# Patient Record
Sex: Female | Born: 1985 | Race: White | Hispanic: No | Marital: Married | Smoking: Former smoker
Health system: Southern US, Community
[De-identification: ages and names within clinical notes are randomized; demographics above are authoritative.]

## PROBLEM LIST (undated history)

## (undated) DIAGNOSIS — F909 Attention-deficit hyperactivity disorder, unspecified type: Secondary | ICD-10-CM

## (undated) DIAGNOSIS — G5750 Tarsal tunnel syndrome, unspecified lower limb: Secondary | ICD-10-CM

## (undated) DIAGNOSIS — M67472 Ganglion, left ankle and foot: Secondary | ICD-10-CM

## (undated) DIAGNOSIS — N946 Dysmenorrhea, unspecified: Secondary | ICD-10-CM

## (undated) DIAGNOSIS — Z803 Family history of malignant neoplasm of breast: Secondary | ICD-10-CM

## (undated) DIAGNOSIS — Z9189 Other specified personal risk factors, not elsewhere classified: Secondary | ICD-10-CM

## (undated) DIAGNOSIS — M67471 Ganglion, right ankle and foot: Secondary | ICD-10-CM

## (undated) HISTORY — DX: Ganglion, right ankle and foot: M67.471

## (undated) HISTORY — DX: Ganglion, left ankle and foot: M67.472

## (undated) HISTORY — PX: WISDOM TOOTH EXTRACTION: SHX21

## (undated) HISTORY — DX: Other specified personal risk factors, not elsewhere classified: Z91.89

## (undated) HISTORY — DX: Dysmenorrhea, unspecified: N94.6

## (undated) HISTORY — PX: APPENDECTOMY: SHX54

## (undated) HISTORY — DX: Family history of malignant neoplasm of breast: Z80.3

## (undated) HISTORY — DX: Tarsal tunnel syndrome, unspecified lower limb: G57.50

## (undated) HISTORY — DX: Attention-deficit hyperactivity disorder, unspecified type: F90.9

---

## 2012-02-22 ENCOUNTER — Observation Stay: Payer: Self-pay | Admitting: Surgery

## 2012-02-22 HISTORY — PX: APPENDECTOMY: SHX54

## 2012-02-22 LAB — URINALYSIS, COMPLETE
Bilirubin,UR: NEGATIVE
Leukocyte Esterase: NEGATIVE
RBC,UR: 1 /HPF (ref 0–5)

## 2012-02-22 LAB — COMPREHENSIVE METABOLIC PANEL
BUN: 17 mg/dL (ref 7–18)
Calcium, Total: 9 mg/dL (ref 8.5–10.1)
Chloride: 105 mmol/L (ref 98–107)
Co2: 29 mmol/L (ref 21–32)
EGFR (African American): >60
Glucose: 98 mg/dL (ref 65–99)
Osmolality: 279 (ref 275–301)
Potassium: 4 mmol/L (ref 3.5–5.1)
Sodium: 139 mmol/L (ref 136–145)
Total Protein: 7.6 g/dL (ref 6.4–8.2)

## 2012-02-22 LAB — PREGNANCY, URINE: Pregnancy Test, Urine: NEGATIVE m[IU]/mL

## 2012-02-22 LAB — CBC
HGB: 12.4 g/dL (ref 12.0–16.0)
MCHC: 33.6 g/dL (ref 32.0–36.0)
Platelet: 217 10*3/uL (ref 150–440)
RDW: 12.8 % (ref 11.5–14.5)
WBC: 12.1 10*3/uL — ABNORMAL HIGH (ref 3.6–11.0)

## 2012-02-24 LAB — PATHOLOGY REPORT

## 2012-07-04 ENCOUNTER — Ambulatory Visit
Admission: RE | Admit: 2012-07-04 | Discharge: 2012-07-04 | Disposition: A | Discharge status: Home / Self Care | Payer: No Typology Code available for payment source | Source: Ambulatory Visit | Attending: Family Medicine | Admitting: Family Medicine

## 2012-07-04 ENCOUNTER — Other Ambulatory Visit: Payer: Self-pay | Admitting: Family Medicine

## 2012-07-04 DIAGNOSIS — Z0000 Encounter for general adult medical examination without abnormal findings: Secondary | ICD-10-CM

## 2013-04-10 ENCOUNTER — Observation Stay: Payer: Self-pay | Admitting: Obstetrics and Gynecology

## 2013-04-10 LAB — WET PREP, GENITAL

## 2013-04-12 ENCOUNTER — Observation Stay: Payer: Self-pay | Admitting: Obstetrics and Gynecology

## 2013-06-08 ENCOUNTER — Observation Stay: Payer: Self-pay

## 2013-06-11 ENCOUNTER — Ambulatory Visit: Payer: Self-pay | Admitting: Obstetrics and Gynecology

## 2013-06-11 LAB — CBC WITH DIFFERENTIAL/PLATELET
Eosinophil %: 0.5 %
HCT: 34.5 % — ABNORMAL LOW (ref 35.0–47.0)
HGB: 12.1 g/dL (ref 12.0–16.0)
Lymphocyte #: 1.6 10*3/uL (ref 1.0–3.6)
MCH: 32.9 pg (ref 26.0–34.0)
MCHC: 35.1 g/dL (ref 32.0–36.0)
Monocyte #: 0.6 x10 3/mm (ref 0.2–0.9)
Platelet: 174 10*3/uL (ref 150–440)
RDW: 13.2 % (ref 11.5–14.5)

## 2013-06-12 ENCOUNTER — Inpatient Hospital Stay: Payer: Self-pay | Admitting: Obstetrics and Gynecology

## 2013-06-13 LAB — HEMATOCRIT: HCT: 24.1 % — ABNORMAL LOW (ref 35.0–47.0)

## 2014-09-16 DIAGNOSIS — Z803 Family history of malignant neoplasm of breast: Secondary | ICD-10-CM

## 2014-09-16 HISTORY — DX: Family history of malignant neoplasm of breast: Z80.3

## 2015-02-06 NOTE — Op Note (Signed)
PATIENT NAME:  Melissa Pierce, Li MR#:  [id number] DATE OF BIRTH:  1986/10/11  DATE OF PROCEDURE:  06/12/2013  PREOPERATIVE DIAGNOSES: 1. Term intrauterine pregnancy at 39 weeks zero days gestation.  2. 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   POSTOPERATIVE DIAGNOSES:  1. Term intrauterine pregnancy at 39 weeks zero days gestation.  2. 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   PROCEDURE  PERFORMED: Low transverse c section via Pfannenstiel skin incision.   ANESTHESIA USED: Spinal.   PRIMARY SURGEON: Dr. Vena AustriaAndreas Jhan Conery   ESTIMATED BLOOD LOSS: 600 mL.  OPERATIVE FLUIDS: 1200 mL of crystalloid.   URINE OUTPUT: 150 mL of clear urine.   COMPLICATIONS: None.   INTRAOPERATIVE FINDINGS: Normal tubes, ovaries, and uterus. Liveborn female infant weighing 7 pounds, 8 ounces, 3390 grams, Apgars of 8 and 9.   SPECIMENS REMOVED: None.   DRAINS OR TUBES: Foley to gravity drainage, On-Q catheter system.   PREOPERATIVE ANTIBIOTICS: Two grams of Ancef.   THE PATIENT CONDITION FOLLOWING PROCEDURE: Stable.   PROCEDURE IN DETAIL: Risks, benefits, and alternatives of the procedure were discussed with the patient prior to proceeding to the Operating Room. The patient had received predelivery [facility] with the decision made to proceed with primary low transverse cesarean section given her prior birth trauma with evidence of internal sphincter defect in the anal ultrasound. The patient was taken to the operating room where she was placed under spinal anesthesia. She was positioned in the supine position, prepped and draped in the usual sterile fashion. A timeout procedure was performed. The level of anesthetic was checked prior to proceeding with the case and noted to be adequate. A Pfannenstiel skin incision was made 2 cm above the  pubic symphysis, carried down sharply to the level of the rectus fascia using a scalpel. The fascia was then incised in the midline and the fascial incision extended using Mayo scissors. The superior border of the rectus fascia was grasped with 2 Coker clamps. The underlying rectus muscles were dissected off the fascia using blunt dissection.  The median raphe was incised using Mayo scissors. The inferior border of the rectus fascia was dissected off the rectus muscles in a similar fashion. The midline was identified. The peritoneum was entered bluntly, and the peritoneal incision extended using manual traction. A bladder blade was placed displacing the bladder cephalad. Following this, a bladder flap was created using Metzenbaum scissors and further developed digitally. The bladder blade was then replaced. A low transverse incision was made on the uterus using a scalpel. The uterus was entered bluntly using the operator's finger. Hysterotomy incision was extended using manual traction. Placing the operator's hand into the hysterotomy incision the infant was noted to be in OA position. Amniotomy was performed using an Allis clamp noting clear fluid. The vertex was grasped, flexed, brought to the incision, and delivered atraumatically using fundal pressure. The remainder of the body delivered with ease. The infant was suctioned, cord was clamped and cut and the infant was passed to the awaiting pediatrician. The placenta was then delivered using manual extraction. The uterus was exteriorized and wiped clean of clots and debris, and the hysterotomy incision was repaired using a two layer closure of 0 Vicryl with the first being a running locked, the second a vertical imbricating. The uterus was then returned to the abdomen. The peritoneal gutters  were wiped clean of clots and debris. The muscles were reapproximated in the midline using a 2-0 Vicryl mattress stitch. Following this, the superior border of the rectus  fascia was grasped with two Coker clamps. The On-Q catheters were placed subfascially as per the usual protocol. Following this, the fascia was then closed using a looped #1 PDS in a running fashion. Subcutaneous tissue was irrigated and hemostasis achieved using a Bovie. Skin was closed using a insorb stapler. The On-Q catheters were bolused with 5% of 0.5% bupivacaine each. Sponge, needle, and instrument counts were correct x2. The patient tolerated the procedure well and was taken to the recovery room in stable condition.    ____________________________ Florina Ou. Bonney Aid, MD ams:sg D: 06/20/2013 18:27:00 ET T: 06/20/2013 19:35:03 ET JOB#: [id number]  cc: Florina Ou. Bonney Aid, MD, <Dictator> Lorrene Reid MD ELECTRONICALLY SIGNED 07/09/2013 20:06

## 2015-02-08 NOTE — Op Note (Signed)
PATIENT NAME:  Melissa Pierce, Melissa Pierce MR#:  [id number] DATE OF BIRTH:  1986-10-03  DATE OF PROCEDURE:  02/22/2012  PROCEDURES PERFORMED:  1. Laparoscopic appendectomy.  2. Suture repair of colon injury.   SURGEON: Claude MangesWilliam F. Andre Gallego, MD  PREOPERATIVE DIAGNOSIS: Acute appendicitis.  POSTOPERATIVE DIAGNOSES:  1. Acute appendicitis, suppurative.  2. 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   PROCEDURE IN DETAIL: The patient was placed supine on the Operating Room table and prepped and draped in the usual sterile fashion. A limited incision was made in the supraumbilical midline and this was carried down through the linea alba with the electrocautery and horizontal mattress sutures of 0 Vicryl were placed and as I cut into the peritoneum in order to place a Hassan cannula I injured an artery. Ultimately this was elevated into the wound (after the fascial opening was enlarged slightly) and I was able to ascertain that this was a pericolonic artery, either an artery going directly into the colon or an artery on the very thin omentum draping over the colon. In any event, I was able to control this with a single 2-0 silk ligature but I subsequently repaired the colon with 3-0 silk seromuscular Lembert sutures oriented in a transverse fashion over top of this. A Hassan cannula could then be introduced and a 15 mmHg CO2 pneumoperitoneum was created. There was some blood in the peritoneum from the prior bleeding and this was all suctioned clear and a total of 2 liters of warm normal saline was used to irrigate out the entire abdomen such that there was essentially no remaining blood present. 1 to 1.5 liters of this irrigant was performed initially and then the last 500 mL of irrigant was used after the appendectomy. All of the gutters and the subdiaphragmatic spaces in the perisplenic region and the perihepatic region were all suctioned clear as well as the pelvis such that there was only some old blood present at the top of the  omentum at the conclusion of the procedure. The main portion of the procedure (the appendectomy) was undertaken by finding the tip of the appendix which was exudative and this was surrounded in fairly dense fibrinous scar tissue in a retrocecal location and the appendix was bluntly dissected out of this inflammatory cavity and the mesoappendix was taken down with the Harmonic scalpel down to the appendiceal base and the appendectomy was performed with an Endo GIA stapling device. This was placed just across a few millimeters of the cecum below the appendix base as the appendix was quite abnormally shaped in that it was very short and very thickened even up to the cecum. The appendix was placed in an Endo Catch bag and extracted from the abdomen via the supraumbilical port site and then the remainder of the irrigation was performed. Hemostasis was excellent and as previously stated all of the blood was successfully evacuated from the peritoneal cavity. The omentum was very thin but it was draped over top of the small intestine down towards the pelvis and then the peritoneum was desufflated and decannulated and the linea alba was closed with a running 0 PDS suture and the previously placed horizontal mattress sutures of 0 Vicryl were tied one to another. All three skin sites were closed with subcuticular 5-0 Monocryl and suture strips. The patient tolerated the procedure well.    ____________________________ Claude MangesWilliam F. Yanni Ruberg, MD wfm:cms D: 02/22/2012 22:12:47 ET T: 02/23/2012 11:16:54 ET JOB#: [id number]  cc: Claude MangesWilliam F. Brunilda Eble, MD, <Dictator> Claude MangesWILLIAM F Sherley Mckenney MD ELECTRONICALLY SIGNED  02/23/2012 20:03 

## 2015-02-08 NOTE — Discharge Summary (Signed)
PATIENT NAME:  Melissa Pierce, Melissa Pierce MR#:  [id number] DATE OF BIRTH:  21-Sep-1986  DATE OF ADMISSION:  02/22/2012 DATE OF DISCHARGE:  02/24/2012  PRINCIPLE DIAGNOSIS: Acute appendicitis, suppurative.   PRINCIPLE PROCEDURE PERFORMED DURING THIS ADMISSION: Laparoscopic appendectomy.   HOSPITAL COURSE: [name] was admitted to the hospital and given IV antibiotics and taken to the Operating Room where she underwent the above-mentioned procedure for the above-mentioned diagnosis. She was discharged home on postoperative day two and asked to make an appointment to follow-up with me.   ____________________________ Claude MangesWilliam F. Diavion Labrador, MD wfm:rbg D: 03/08/2012 07:53:49 ET T: 03/09/2012 09:40:28 ET JOB#: [id number]  cc: Claude MangesWilliam F. Sohan Potvin, MD, <Dictator> Claude MangesWILLIAM F Reshad Saab MD ELECTRONICALLY SIGNED 03/16/2012 20:25

## 2015-02-08 NOTE — H&P (Signed)
PATIENT NAME:  Melissa Pierce, Melissa MR#:  [id number] DATE OF BIRTH:  [birth year]  DATE OF ADMISSION:  02/22/2012  ADMITTING DIAGNOSIS: Acute appendicitis.   HISTORY: This is a 29 year old otherwise healthy white female presents with her husband with a two day history of progressive abdominal pain and cramping which has migrated to the right lower quadrant associated with no nausea, no vomiting, no anorexia, no diarrhea, no sick contacts, no fever. Patient had pain all day yesterday, essentially could not really work or do anything. Came to the Emergency Room today. White count was found to be elevated. Patient had CT scan of the abdomen and pelvis which is consistent with appendicitis.   ALLERGIES: None.   MEDICATIONS: Oral birth control pill.   PAST MEDICAL HISTORY: None.   PAST SURGICAL HISTORY: Wisdom tooth extraction.   SOCIAL HISTORY: She is married, has an 487-month-old child. Does not smoke. Does not drink. Does not use drugs. Is employed.   REVIEW OF SYSTEMS: As described above.   PHYSICAL EXAMINATION:  GENERAL: The patient is an alert and oriented thin, white female in no obvious distress.   VITAL SIGNS: Temperature 96, pulse 84, respiratory rate 22 and unlabored, blood pressure 121/75, room air saturation 100%, weight 130 pounds, BMI 22.3, height 5 foot 4.   NECK: Supple. No adenopathy.   LUNGS: Clear.   HEART: Regular rate and rhythm.   ABDOMEN: Abdomen demonstrates peritoneal signs within the right lower quadrant with a positive Murphy sign and positive Rovsing's sign. No surgical scars. No abdominal mass. No hernias.   RECTAL/GENITOURINARY: Deferred.   EXTREMITIES: Well perfused, warm. No rash. No jaundice, scleral icterus.   NEUROLOGIC/PSYCHIATRIC: Unremarkable and normal.   LABORATORY, DIAGNOSTIC, AND RADIOLOGICAL DATA: Glucose 98, BUN 17, creatinine 0.6, sodium 139, potassium 4.0, chloride 105, CO2 29, calcium 9.0, white count 12.1, hemoglobin 12.4, platelet count 217,000.  Urinalysis is negative. Urine pregnancy test is negative. Review of CT scan demonstrates a dilated appearing retrocecal appendicitis. No other abnormalities are noted.   IMPRESSION: 29 year old healthy white female with acute appendicitis which appears to be suppurative.   PLAN: Patient will be admitted. Laparoscopic appendectomy or possible open appendectomy was discussed with the patient and her husband. All of their questions are answered. Preop orders have been written for heparin and intravenous Mefoxin. Due to the Operating Room schedule she will need be admitted and done later this afternoon or early evening. I had outlined with her the risks of surgery and all of her questions were answered.   TOTAL TIME SPENT: 45 minutes.   ____________________________ Redge GainerMark A. Egbert GaribaldiBird, MD mab:cms D: 02/22/2012 13:29:26 ET T: 02/22/2012 13:40:08 ET JOB#: [id number]  cc: Loraine LericheMark A. Egbert GaribaldiBird, MD, <Dictator> Raynald KempMARK A Sherill Wegener MD ELECTRONICALLY SIGNED 02/24/2012 12:53

## 2015-02-24 NOTE — H&P (Signed)
L&D Evaluation:  History:  HPI 29 yo [id number] with EDC=06/19/2013 by LMP=11/13/2012 presents at 289w3d gestational age with c/o decreased fetal movement this AM. BAby has moved this AM but not "the usual amt" Denies vaginal spotting or LOF. Has been having more frequent contractions, but inconsistent in frequency and strength.    Her pregnancy history is complicated by a fourth-degree laceration in her first delivery about 2 years ago. She has recently been seen by a urogynecologist, Dr Ashley RoyaltyMatthews, for the stool leakage she has had since her delivery and she advised against future vaginal deliveries, and has recommended having a CS. Her CS is scheduled for next week.  O+/ RI / VZI   Presents with decreased fetal movement   Patient's Medical History No Chronic Illness   Patient's Surgical History Appendectomy  wisdom teeth extraction, 4th degree repair   Medications Pre Natal Vitamins   Allergies Iodine   Social History none   Family History Non-Contributory   ROS:  ROS see HPI   Exam:  Vital Signs stable   Urine Protein not completed   General no apparent distress   Mental Status clear   Abdomen gravid, non-tender   Fetal Position vtx   Pelvic no external lesions, 2/70%/-2   Mebranes Intact   FHT normal rate with no decels, 135-140 with accels to 150s to 160s   FHT Description moderate variability   Ucx 4 ctxs in 40 min   Skin dry   Impression:  Impression IUP at 38 3/7 weeks with reactive NST   Plan:  [facility] precautions.. FU as scheduled for preop appt   Electronic Signatures: Trinna BalloonGutierrez, Marquerite Forsman L (CNM)  (Signed 23-Aug-14 13:13)  Authored: L&D Evaluation   Last Updated: 23-Aug-14 13:13 by Trinna BalloonGutierrez, Pricilla Moehle L (CNM)

## 2015-02-24 NOTE — H&P (Signed)
L&D Evaluation:  History:  HPI 29 yo G4P1021 at 521w0d gestational age by LMP consistent with 1st trimester ultrasound.  Patient presents with vaginal spotting since about 1pm today.  She notes that she has had Braxton-Hicks contractions since yesterday.  She has noted positive fetal movement and leakage of fluid.  She noted "spotting" of blood in her underwear this afternoon.  It has also stained her jeans.  Since then she has had occasional spotting. She was seen by Mrs Dortha KernHuprich in clinic today and on pelvic exam there was a small amount of dark blood in the vaginal vault with no active bleeding from the os.  Mrs Dortha KernHuprich noted no other source of bleeding.  The patient does not smoke or do drugs. Her placenta is located posterior and fundally.   She has had no uterine surgeries.  She denies vaginal symptoms and recent intercourse.  She has no urinary symptoms. Her pregnancy history is complicated by a fourth-degree laceration in her first delivery about 2 years ago.  O+/ RI / VZI   Patient's Medical History No Chronic Illness   Patient's Surgical History Appendectomy  wisdom teeth extraction   Medications Pre Natal Vitamins   Allergies Iodine   Social History none   Family History Non-Contributory   ROS:  ROS All systems were reviewed.  HEENT, CNS, GI, GU, Respiratory, CV, Renal and Musculoskeletal systems were found to be normal., unless noted in HPI   Exam:  Vital Signs BP 110/66, P 86   General no apparent distress   Mental Status clear   Chest clear   Heart normal sinus rhythm   Abdomen gravid, non-tender   Estimated Fetal Weight Average for gestational age   Fetal Position cephalic by ultrasound   Back no CVAT   Edema no edema   Pelvic no external lesions, at posterior forchette there is an area of tenderness and erythema, no active bleeding from this site.  Per patient this site is from her repair of her 4th degree laceration.  No vaginal lesions noted.  The cervix  appears somewhat friable with tenderness noted with light application of pressure with a large q-tip.  about 1 ml of dark blood in vaginal vault.  no active bleeding noted.   Mebranes Intact   FHT normal rate with no decels   FHT Description 130/mod var/+accels/no decels   Ucx irregular, 3-4 q 10 min   Ucx Pain Scale very mild   Other Bedside ultrasound shows viable Intrauterine pregnancy in cephalic presentation with subjectively normal amniotic fluid.  positive fetal movement noted.  + fetal cardiac activity noted.  Placenta noted to be fundal and posterior with no obvious abruption.   Impression:  Impression vaginal bleeding in 3rd trimester   Plan:  Plan UA, EFM/NST, monitor contractions and for cervical change   Comments 1) Vaginal bleeding: no active bleeding at this time.  Though she has had some event where she is having some amount of what appears to be dark, likely older blood.  This is either coming from the uterus or from the cervix.  This is stable for now.  She was given very strict precautions regarding vaginal bleeding should any further episods occur, especially bright red bleeding.  She has a regularly scheduled appt first thing tomorrow morning.  I encouraged her to keep that appt for follow up and assessment of bleeding. I also encouraged her to have a low threshold for returning to L&D should further bleeding occur.  We discussed the etiologies  of vaginal bleeding in the 3rd trimester, including placental abruption, cervical/vaginal infection.   I collected a wet mount (negative), gonorrhea/chlamydia for NAAT.   2) preterm contractions: She is having contractions right now, though they seemed to slow somewhat while she was here. She is hydrating well PO.  Checked cultures, as above, wet mount performed.  GBS collected just in case.  Cervical exam by me showed a closed cervix.  She was called finger tip by Mrs Dortha KernHuprich in clinic today.  This is likely an interobserver  difference that represents no significant clinical change.  Discussed use of BMTZ.  Discussed use when concern for need for or emminent delivery within 7 days.  At this point I do not have good evidence that suggests that she will have delivery within 7 days. Will hold BMTZ for now.  This could change with any change in clinical status.  GBS collected today. Discussed need to  collect again in 5 weeks (or at her 36 week visit), if negative today.  3) Fetal well being: reassuring overall.  Ultrasound was reassuring.  FHR tracing was category 1 and she had a negative CST.    4) Follow up tomorrow AM as previously discussed.   Follow Up Appointment already scheduled. tomorrow   Labs:  Lab Results: Routine Micro:  25-Jun-14 18:23   Micro Text Report WET PREP   COMMENT                   RARE WHITE BLOOD CELLS SEEN   COMMENT                   NO TRICHOMONAS,SPERMATOZOA,YEAST,OR CLUE CELLS SEEN   ANTIBIOTIC                       Comment 1. RARE WHITE BLOOD CELLS SEEN  Comment 2. NO TRICHOMONAS,SPERMATOZOA,YEAST,OR CLUE CELLS SEEN  Result(s) reported on 10 Apr 2013 at 06:43PM.   Electronic Signatures: Conard NovakJackson, Ra Pfiester D (MD)  (Signed 25-Jun-14 19:12)  Authored: L&D Evaluation, Labs   Last Updated: 25-Jun-14 19:12 by Conard NovakJackson, Leighton Brickley D (MD)

## 2015-02-24 NOTE — H&P (Signed)
L&D Evaluation:  History:  HPI 29 yo G4P1021 at 8773w2d gestational age by LMP consistent with 1st trimester ultrasound.  Patient presents with vaginal spotting that initially started 2 days ago and was evaluated in clinic as well as here on L&D on 04/10/13.  Her placenta is located posterior and fundally per Dr. Edison PaceJackson's US on 04/10/13.   She has had no uterine surgeries.  She denies vaginal symptoms and recent intercourse.  She has no urinary symptoms. Reports bleeding had stopped 04/10/13 this AM some pink discharge with passage of small clot.  No futher bleeding or clots, no abdominal pain or contractions.  Denies LOF.  Good fetal movement.   Her pregnancy history is complicated by a fourth-degree laceration in her first delivery about 2 years ago.  O+/ RI / VZI   Presents with vaginal bleeding   Patient's Medical History No Chronic Illness   Patient's Surgical History Appendectomy  wisdom teeth extraction   Medications Pre Natal Vitamins   Allergies Iodine   Social History none   Family History Non-Contributory   ROS:  ROS All systems were reviewed.  HEENT, CNS, GI, GU, Respiratory, CV, Renal and Musculoskeletal systems were found to be normal., unless noted in HPI   Exam:  Vital Signs stable   General no apparent distress   Mental Status clear   Chest clear   Heart normal sinus rhythm   Abdomen gravid, non-tender, soft   Estimated Fetal Weight Average for gestational age   Fetal Position vtx   Back no CVAT   Edema no edema   Pelvic no external lesions, ftp/thick/high light dark brown discharge noted in posterior vault consistent with old blood no active bleeding   Mebranes Intact   FHT normal rate with no decels   FHT Description 1350/mod var/+accels/no decels   Ucx absent   Other movement noted.  + fetal cardiac activity noted.  Placenta noted to be fundal and posterior with no obvious abruption.   Impression:  Impression painless vaginal bleeding in  3rd trimester   Plan:  Plan EFM/NST, monitor contractions and for cervical change   Comments - GC/CT from 6/25 pending - no active bleeding - Based on exam and category I tracing no concern for abruption, PTL, and previa ruled out by prior US - Routine bleeding precautions and reassurance,  has follow up on 04/23/13   Follow Up Appointment already scheduled   Electronic Signatures: Lorrene ReidStaebler, Channing Savich M (MD)  (Signed 27-Jun-14 14:00)  Authored: L&D Evaluation   Last Updated: 27-Jun-14 14:00 by Lorrene ReidStaebler, Tyra Michelle M (MD)

## 2016-08-09 ENCOUNTER — Encounter: Payer: Self-pay | Admitting: Family Medicine

## 2016-08-09 ENCOUNTER — Ambulatory Visit (INDEPENDENT_AMBULATORY_CARE_PROVIDER_SITE_OTHER): Payer: Managed Care, Other (non HMO) | Admitting: Family Medicine

## 2016-08-09 VITALS — BP 116/70 | HR 65 | Temp 98.1°F | Resp 16 | Ht 65.0 in | Wt 154.0 lb

## 2016-08-09 DIAGNOSIS — R202 Paresthesia of skin: Secondary | ICD-10-CM

## 2016-08-09 MED ORDER — PREDNISONE 10 MG PO TABS: ORAL_TABLET | ORAL | 0 refills | Status: DC

## 2016-08-09 NOTE — Assessment & Plan Note (Addendum)
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 No obvious deformity or pain on exam. Symptoms are predictable >1 mile into runs, otherwise asymptomatic with casual activity. No known injury or trauma. Differential includes: possible morton's neuroma given location assoc with 4th toes, possible peroneal nerve entrapment but symptoms do involve all 5 digits so less likely, less likely tarsal tunnel syndrome, also consider metatarsalgia related to abnormal forefoot mechanics.  Plan: 1. Trial on prednisone 6d taper for potent anti-inflammatory, then may do 1-2 week trial on NSAID if helping, modify running for few days with relative rest, then can resume, RICE if swelling 2. Discussion that may experience significant relief ultimately with rest and activity modification, as this has helped in the past, however agree given chronic nature and worsening, will explore further work-up 3. [place] [facility] [place] for [place] and management, anticipate [facility] ultrasound, if identify morton neuroma may consider steroid inj, likely will need custom padding or orthotics 4. Follow-up as needed

## 2016-08-09 NOTE — Progress Notes (Signed)
Subjective:    Patient ID: Melissa Pierce, female    DOB: Aug 18, 1986, 30 y.o.   MRN: [id number]  Melissa Pierce is a 30 y.o. female presenting on 08/09/2016 for Numbness (as per pt runs has some issue with feets)  HPI  Bilateral Feet Numbness (R>L): - Reports symptoms onset for past 2-3 years, gradual onset with with feet falling asleep and 4th and 5th toes (Right worse than Left), going numb, starts in ball of foot but does not progress to heel. Describes numbness during run, but when stops to wake up foot, she will feel sharp pains with "pins and needles". Worsening over past 3 months. - Runs mostly outdoors, on asphalt streets, about >3 miles, around 4-5 days a week, also does some obstacle course racing (few times a year), usually about 1 to 1.5 miles in onset of symptoms predictable  - Also reports onset of symptoms seem to be related to onset after 2nd pregnancy (3 years ago) prior did not have any symptoms - Has high arches, has tried multiple shoe inserts, different shoes without relief - Takes very infrequent ibuprofen for ankle (prior history of R ankle sprain) - admits to occasional R ankle swelling after activity - denies any acute injury, trauma, or fall, redness, bruising, significant swelling, weakness, numbness or tingling elsewhere in body   Social History  Substance Use Topics  . Smoking status: Former Games developer  . Smokeless tobacco: Former Neurosurgeon  . Alcohol use Yes    Review of Systems Per HPI unless specifically indicated above     Objective:    BP 116/70   Pulse 65   Temp 98.1 F (36.7 C) (Oral)   Resp 16   Ht 5\' 5"  (1.651 m)   Wt 154 lb (69.9 kg)   LMP 08/05/2016   BMI 25.63 kg/m   Wt Readings from Last 3 Encounters:  08/09/16 154 lb (69.9 kg)    Physical Exam  Constitutional: She appears well-developed and well-nourished. No distress.  Well-appearing, comfortable, cooperative, athletic  Musculoskeletal: She exhibits no edema or deformity.  Bilateral  Feet Inspection: Normal appearance, symmetrical. No deformity. Standing barefoot without significant pes cavus, does have some collapse of forefoot laterally. No edema, erythema, or ecchymosis.  Palpation: Forefoot, digits and interdigital spaces non-tender, no deformity or pain at head of 5th metatarsal b/l, no palpable masses or appreciated neuroma. Ankles and achilles non-tender. Heel and hindfoot non tender. Mid foot non-tender without abnormality.  ROM: Full active ROM plantar flex/ext, inv/eversion Special Testing: Talar tilt and shift intact ROM. Tinel's over lateral malleolus peroneal nerve with some reproduced tingling into 4th, 5th digits. Negative Tinel's over posterior tibial nerve. No clear reproduction of symptoms Strength: Full 5/5 str ankle/toe plantar/dorsiflex Neurovascular: Distally intact to light touch  Neurological: She is alert. She has normal reflexes. She exhibits normal muscle tone. Coordination normal.  Skin: Skin is warm and dry. She is not diaphoretic.  Nursing note and vitals reviewed.      Assessment & Plan:   Problem List Items Addressed This Visit    Paresthesia of foot, bilateral - Primary    Suggestive of neuropathic pain etiology with paresthesias localized more to 4/5th digits R>L but bilateral, likely related to overuse and mechanics, no significant arch pathology, has inserts. No obvious deformity or pain on exam. Symptoms are predictable >1 mile into runs, otherwise asymptomatic with casual activity. No known injury or trauma. Differential includes: possible morton's neuroma given location assoc with 4th toes, possible peroneal nerve entrapment  but symptoms do involve all 5 digits so less likely, less likely tarsal tunnel syndrome, also consider metatarsalgia related to abnormal forefoot mechanics.  Plan: 1. Trial on prednisone 6d taper for potent anti-inflammatory, then may do 1-2 week trial on NSAID if helping, modify running for few days with relative  rest, then can resume, RICE if swelling 2. Discussion that may experience significant relief ultimately with rest and activity modification, as this has helped in the past, however agree given chronic nature and worsening, will explore further work-up 3. Referral to [facility] [place] for eval and management, anticipate [facility] ultrasound, if identify morton neuroma may consider steroid inj, likely will need custom padding or orthotics 4. Follow-up as needed      Relevant Medications   predniSONE (DELTASONE) 10 MG tablet   Other Relevant Orders   Ambulatory referral to Sports Medicine    Other Visit Diagnoses   None.     Meds ordered this encounter  Medications  .       .       .       . predniSONE (DELTASONE) 10 MG tablet    Sig: Take 6 tabs with breakfast Day 1, 5 tabs Day 2, 4 tabs Day 3, 3 tabs Day 4, 2 tabs Day 5, 1 tab Day 6.    Dispense:  21 tablet    Refill:  0      Follow up plan: Return in about 4 weeks (around 09/06/2016), or if symptoms worsen or fail to improve, for foot pain.  Saralyn PilarAlexander Anjani Feuerborn, DO [facility] 08/09/2016, 9:55 AM

## 2016-08-09 NOTE — Patient Instructions (Addendum)
Thank you for coming in to clinic today.  1. Most likely have an overuse injury with nerve compression or entrapment, sometimes this can cause a Morton's Neuroma between the toes, which is a small growth on nerve usually due to inflammation, consistent with your symptoms of forefoot and toe burning and paresthesias with pins and needles - Start trial of Prednisone 6 day taper, maybe reduce running while on prednisone for a few days - After prednisone, may use ibuprofen or aleve regularly for 1-2 weeks then stop and use as needed - may continue ice or heat, elevation if swelling  Referral to [facility] Address: [address], [place], [place] [address] Phone: [phone]  They will likely do Ultrasound, consider injection if find a neuroma, otherwise may do custom padding and orthotics as needed  Please schedule a follow-up appointment with Dr. Althea CharonKaramalegos in 1-3 months as needed for foot pain  If you have any other questions or concerns, please feel free to call the clinic or send a message through MyChart. You may also schedule an earlier appointment if necessary.  Saralyn PilarAlexander Lovetta Condie, DO [facility], [facility]             Arch Pain Rehabilitation Exercises   Stretching: You may begin exercising the muscles of your foot right away by gently stretching them with the towel stretch. When the towel stretch becomes too easy, you may begin doing the standing calf stretch and plantar fascia stretch.  Towel stretch: Sit on a hard surface with your injured leg stretched out in front of you. Loop a towel around the ball of your foot and pull the towel toward your body keeping your knee straight. Hold this position for 15 to 30 seconds then relax. Repeat 3 times. Standing calf stretch: Facing a wall, put your hands against the wall at about eye level. Keep the injured leg back, the uninjured leg forward, and the heel of your injured leg on the floor.  Turn your injured foot slightly inward (as if you were pigeon-toed) as you slowly lean into the wall until you feel a stretch in the back of your calf. Hold for 15 to 30 seconds. Repeat 3 times. Do this exercise several times each day. When you can stand comfortably on your injured foot, you can begin stretching the plantar fascia at the bottom of your foot.  Plantar fascia stretch: Stand with the ball of your injured foot on a stair. Reach for the bottom step with your heel until you feel a stretch in the arch of your foot. Hold this position for 15 to 30 seconds and then relax. Repeat 3 times. Static and dynamic balance exercises  Place a chair next to your non-injured leg and stand upright. (This will provide you with balance if needed.) Stand on your injured foot. Try to raise the arch of your foot while keeping your toes on the floor. Try to maintain this position and balance on your injured side for 30 seconds. This exercise can be made more difficult by doing it on a piece of foam or a pillow, or with your eyes closed.  Stand in the same position as above. Keep your foot in this position and reach forward in front of you with your injured side's hand, allowing your knee to bend. Repeat this 10 times while maintaining the arch height. This exercise can be made more difficult by reaching farther in front of you. Do 2 sets.  Stand in  the same position as above. While maintaining your arch height, reach the injured side's hand across your body toward the chair. The farther you reach, the more challenging the exercise. Do 2 sets of 10.  Towel pickup: With your heel on the ground, pick up a towel with your toes. Release. Repeat 10 to 20 times. When this gets easy, add more resistance by placing a book or small weight on the towel.  Frozen can roll: Roll your bare injured foot back and forth from your heel to your mid-arch over a frozen juice can. Repeat for 3 to 5 minutes. This exercise is  particularly helpful if done first thing in the morning. Resisted dorsiflexion: Sit with your injured leg out straight and your foot facing a doorway. Tie a loop in one end of the tubing. Put your foot through the loop so that the tubing goes around the arch of your foot. Tie a knot in the other end of the tubing and shut the knot in the door. Move backward until there is tension in the tubing. Keeping your knee straight, pull your foot toward your body, stretching the tubing. Slowly return to the starting position. Do 3 sets of 10.  Next, you can begin strengthening the muscles of your foot and lower leg by doing the rest of the exercises.  Strengthening Resisted plantar flexion: Sit with your leg outstretched and loop the middle section of the tubing around the ball of your foot. Hold the ends of the tubing in both hands. Gently press the ball of your foot down and point your toes, stretching the tubing. Return to the starting position. Do 3 sets of 10.  Resisted inversion: Sit with your legs out straight and cross your uninjured leg over your injured ankle. Wrap the tubing around the ball of your injured foot and then loop it around your uninjured foot so that the tubing is anchored there at one end. Hold the other end of the tubing in your hand. Turn your injured foot inward and upward. This will stretch the tubing. Return to the starting position. Do 3 sets of 10.  Resisted eversion: Sit with both legs stretched out in front of you, with your feet about a shoulder's width apart. Tie a loop in one end of the tubing. Put your injured foot through the loop so that the tubing goes around the arch of that foot and wraps around the outside of the uninjured foot. Hold onto the other end of the tubing with your hand to provide tension. Turn your injured foot up and out. Make sure you keep your uninjured foot still so that it will allow the tubing to stretch as you move your injured foot. Return to the  starting position. Do 3 sets of 10.

## 2016-08-10 ENCOUNTER — Ambulatory Visit (INDEPENDENT_AMBULATORY_CARE_PROVIDER_SITE_OTHER): Payer: Managed Care, Other (non HMO) | Admitting: Sports Medicine

## 2016-08-10 ENCOUNTER — Encounter: Payer: Self-pay | Admitting: Sports Medicine

## 2016-08-10 VITALS — BP 111/72 | Ht 65.0 in | Wt 150.0 lb

## 2016-08-10 DIAGNOSIS — R202 Paresthesia of skin: Secondary | ICD-10-CM

## 2016-08-10 MED ORDER — GABAPENTIN 300 MG PO CAPS: 300.0000 mg | ORAL_CAPSULE | Freq: Every day | ORAL | 0 refills | Status: DC

## 2016-08-10 NOTE — Progress Notes (Signed)
   Subjective:    Patient ID: Melissa Pierce, female    DOB: 16-Nov-1985, 30 y.o.   MRN: [id number]  HPI chief complaint: Bilateral foot numbness  Very pleasant 30 year old runner comes in today complaining of 2-3 years of bilateral foot numbness and pain. Her symptoms are present mainly with running. They have been tolerable up until recently. She describes a numbness that begins in her fourth toes bilaterally as she starts to run which will progressively spread to involve the entire forefoot. Her symptoms will resolve about 30 minutes after running. She has tried several different shoe inserts but nothing seems to help. She denies any significant pain in the arch of her foot or at the ankle but she does have a history of a pretty significant right ankle sprain. Her right foot is more symptomatic than the left. Although she has not noticed any swelling in her feet she does note that her shoes seem to be tighter at the end of her run. She denies any swelling or tightness in her calves. No low back pain. No prior foot or ankle surgeries. She takes over-the-counter anti-inflammatories as needed and this does seem to be somewhat helpful.  Past medical history reviewed Medications reviewed Allergies reviewed    Review of Systems    as above Objective:   Physical Exam  Well-developed, well-nourished. No acute distress. Awake alert and oriented 3. Vital signs reviewed  Examination of her feet and ankles in the standing position shows pes planus. Slight pronation with running which is corrected with her current insert. She has a markedly positive Tinel's over the right tarsal tunnel and a slightly positive Tinel's over the left tarsal tunnel. Right ankle also demonstrates a 1+ talar tilt and a slightly positive anterior drawer compared to a negative talar tilt and a negative anterior drawer on the left. No atrophy. No erythema. No soft tissue swelling. Good pulses. Sensation is intact to light touch  grossly.       Assessment & Plan:   Chronic bilateral foot pain and numbness-rule out tarsal tunnel syndrome  I've added some extra cushioning to the forefoot of her current inserts. I'd like to start her on gabapentin 300 mg daily at bedtime.  I'd also like to get bilateral EMG/nerve conduction studies of her lower extremities. Phone follow-up after I review that study. We will delineate further workup and treatment based on those results.

## 2016-10-18 ENCOUNTER — Other Ambulatory Visit: Payer: Self-pay | Admitting: *Deleted

## 2016-10-18 DIAGNOSIS — R202 Paresthesia of skin: Secondary | ICD-10-CM

## 2016-10-18 MED ORDER — GABAPENTIN 300 MG PO CAPS: 300.0000 mg | ORAL_CAPSULE | Freq: Every day | ORAL | 1 refills | Status: DC

## 2016-10-20 ENCOUNTER — Ambulatory Visit (INDEPENDENT_AMBULATORY_CARE_PROVIDER_SITE_OTHER): Payer: Self-pay | Admitting: Neurology

## 2016-10-20 ENCOUNTER — Ambulatory Visit (INDEPENDENT_AMBULATORY_CARE_PROVIDER_SITE_OTHER): Payer: Managed Care, Other (non HMO) | Admitting: Neurology

## 2016-10-20 DIAGNOSIS — G5793 Unspecified mononeuropathy of bilateral lower limbs: Secondary | ICD-10-CM

## 2016-10-20 DIAGNOSIS — R200 Anesthesia of skin: Secondary | ICD-10-CM | POA: Diagnosis not present

## 2016-10-20 DIAGNOSIS — Z0289 Encounter for other administrative examinations: Secondary | ICD-10-CM

## 2016-10-20 NOTE — Procedures (Signed)
[facility]    Provider:  Dr Lucia GaskinsAhern Referring Provider: Reino Bellisraper, Timothy DO  History:  Melissa RaspberryLisa Pierce is a 31 y.o. female here as a referral from Dr. Margaretha Sheffieldraper  for sensory changes in the feet, right> left.   Summary: All nerves and muscles (as indicated in the following tables) were within normal limits.    Conclusion:  This is a normal study. No electrographic evidence for lower extremity mononeuropathy, polyneuropathy or Tarsal Tunnel syndrome.   Cc: Dr. Margaretha Sheffieldraper  [facility]    Nerve / Sites Rec. Site Peak Lat Ref. Amp.1-2 Ref. Distance    ms ms V V cm  R Sural - Ankle (Calf)     Calf Ankle 3.59 ?4.40 21.2 ?6.0 14  R Superficial peroneal - Ankle     Lat leg Ankle 3.54 ?4.40 6.8 ?6.0 14  R Medial plantar, Lateral plantar - Ankle (Medial, lateral sole)     Medial plantar Sole Ankle 3.13 ?3.70 22.5 ?3.0 14     Lateral plantar Sole Ankle 3.13 ?3.70 7.1 ?3.0 14          L Medial plantar, Lateral plantar - Ankle (Medial, lateral sole)     Medial plantar Sole Ankle 3.28 ?3.70 21.1 ?3.0 14     Lateral plantar Sole Ankle 3.44 ?3.70 9.6 ?3.0 14             [facility]    Nerve / Sites Muscle Latency Ref. Amplitude Ref. Rel Amp Segments Distance Lat Diff Velocity Ref. Area    ms ms mV mV %  cm ms m/s m/s mVms  R Peroneal - EDB     Ankle EDB 4.1 ?6.5 7.2 ?2.0 100 Ankle - EDB 9    23.1     Fib head EDB 10.2  6.7  92.6 Fib head - Ankle 28 6.1 46 ?44 22.8     Pop fossa EDB 12.0  6.5  97.2 Pop fossa - Fib head 9 1.8 49 ?44 21.6         Pop fossa - Ankle  7.9     R Tibial - AH     Ankle AH 3.8 ?5.8 17.3 ?4.0 100 Ankle - AH 9    34.3     Pop fossa AH 11.6  14.3  82.5 Pop fossa - Ankle 36 7.8 46 ?41 35.7     F  Wave    Nerve F Lat Ref.   ms ms  R Tibial - AH 47.1 ?56.0                             Side Muscle Nerve Root Ins Act Fibs Psw Amp Dur Poly Recrt Fascics Other  Right MedGastroc Tibial S1-2 Nml Nml Nml Nml Nml Nml Nml Nml Nml                Right VastusMed Femoral L2-4  Nml Nml Nml Nml Nml Nml Nml Nml Nml  Right AntTibialis Dp Br Peron L4-5 Nml Nml Nml Nml Nml Nml Nml Nml Nml  Right AbdDigQuinti LatPlantar S1-2 Nml Nml Nml Nml Nml Nml Nml Nml Nml  Right AbdHallucis MedPlantar S1-2 Nml Nml Nml Nml Nml Nml Nml Nml Nml     Naomie DeanAntonia Ahern, MD  [facility] [address] [place], [place] [address]  Phone [phone] Fax [phone]

## 2016-10-20 NOTE — Progress Notes (Signed)
See procedure note.

## 2016-10-20 NOTE — Progress Notes (Signed)
  [facility]    Provider:  Dr Lucia GaskinsAhern Referring Provider: Reino Bellisraper, Timothy DO  History:  Melissa RaspberryLisa Pierce is a 31 y.o. female here as a referral from Dr. Margaretha Sheffieldraper  for sensory changes in the feet right> left.   Summary: All nerves and muscles (as indicated in the following tables) were within normal limits.    Conclusion:  This is a normal study. No electrographic evidence for mononeuropathy, polyneuropathy or Tarsal Tunnel syndrome.   Cc: Dr. Margaretha Sheffieldraper  [facility]    Nerve / Sites Rec. Site Peak Lat Ref. Amp.1-2 Ref. Distance    ms ms V V cm  R Sural - Ankle (Calf)     Calf Ankle 3.59 ?4.40 21.2 ?6.0 14  R Superficial peroneal - Ankle     Lat leg Ankle 3.54 ?4.40 6.8 ?6.0 14  R Medial plantar, Lateral plantar - Ankle (Medial, lateral sole)     Medial plantar Sole Ankle 3.13 ?3.70 22.5 ?3.0 14     Lateral plantar Sole Ankle 3.13 ?3.70 7.1 ?3.0 14          L Medial plantar, Lateral plantar - Ankle (Medial, lateral sole)     Medial plantar Sole Ankle 3.28 ?3.70 21.1 ?3.0 14     Lateral plantar Sole Ankle 3.44 ?3.70 9.6 ?3.0 14             [facility]    Nerve / Sites Muscle Latency Ref. Amplitude Ref. Rel Amp Segments Distance Lat Diff Velocity Ref. Area    ms ms mV mV %  cm ms m/s m/s mVms  R Peroneal - EDB     Ankle EDB 4.1 ?6.5 7.2 ?2.0 100 Ankle - EDB 9    23.1     Fib head EDB 10.2  6.7  92.6 Fib head - Ankle 28 6.1 46 ?44 22.8     Pop fossa EDB 12.0  6.5  97.2 Pop fossa - Fib head 9 1.8 49 ?44 21.6         Pop fossa - Ankle  7.9     R Tibial - AH     Ankle AH 3.8 ?5.8 17.3 ?4.0 100 Ankle - AH 9    34.3     Pop fossa AH 11.6  14.3  82.5 Pop fossa - Ankle 36 7.8 46 ?41 35.7     F  Wave    Nerve F Lat Ref.   ms ms  R Tibial - AH 47.1 ?56.0                             Side Muscle Nerve Root Ins Act Fibs Psw Amp Dur Poly Recrt Fascics Other  Right MedGastroc Tibial S1-2 Nml Nml Nml Nml Nml Nml Nml Nml Nml                Right VastusMed Femoral L2-4 Nml Nml Nml Nml  Nml Nml Nml Nml Nml  Right AntTibialis Dp Br Peron L4-5 Nml Nml Nml Nml Nml Nml Nml Nml Nml  Right AbdDigQuinti LatPlantar S1-2 Nml Nml Nml Nml Nml Nml Nml Nml Nml  Right AbdHallucis MedPlantar S1-2 Nml Nml Nml Nml Nml Nml Nml Nml Nml     Melissa DeanAntonia Telesia Ates, MD  [facility] [address] [place], [place] [address]  Phone [phone] Fax [phone]

## 2016-11-15 ENCOUNTER — Telehealth: Payer: Self-pay | Admitting: Sports Medicine

## 2016-11-15 NOTE — Telephone Encounter (Signed)
I've tried to reach this patient via telephone to explained to her that her recent EMG/nerve conduction study of both feet was unremarkable but have been unsuccessful. A normal EMG/nerve conduction study does not completely exclude tarsal tunnel syndrome. Future workup could include an MRI of her ankle.

## 2016-12-12 ENCOUNTER — Telehealth: Payer: Self-pay | Admitting: Sports Medicine

## 2016-12-12 NOTE — Telephone Encounter (Signed)
  I spoke with Melissa Pierce on the phone today about her EMG/nerve conduction study findings. This is an unremarkable study. I explained to her that this in no way rules out tarsal tunnel syndrome. Since her office visit with me last October she has been able to continue running. In fact, she is getting ready to run a marathon next weekend. Her 300 mg of gabapentin at night has been of a tremendous help. She also takes intermittent doses of over-the-counter ibuprofen which she has found to be helpful. She did not like the additional padding that I added to her off-the-shelf insert so she is not using those in her running shoes. I recommended that she return to the office sometime after her marathon so that I can reevaluate her. Specifically, I would like to see if we need to put her in a custom orthotic. She is asking about the possibility of increasing her gabapentin in the interim and I've explained to her that she may increase it to 300 mg twice a day as long as she did not get significant side effects.

## 2016-12-19 ENCOUNTER — Telehealth: Payer: Self-pay | Admitting: Obstetrics and Gynecology

## 2016-12-19 NOTE — Telephone Encounter (Signed)
Pt is calling needing refills on her medication for Birthcontrol . Pt is schedule 01/06/17 with Dr. Jean RosenthalJackson. Bc# [phone]

## 2016-12-20 MED ORDER — MICROGESTIN FE 1.5/30 1.5-30 MG-MCG PO TABS: ORAL_TABLET | ORAL | 0 refills | Status: DC

## 2016-12-20 NOTE — Telephone Encounter (Signed)
Refill has been sent in to pharm to last  Until annual

## 2017-01-02 ENCOUNTER — Encounter: Payer: Self-pay | Admitting: Sports Medicine

## 2017-01-02 ENCOUNTER — Ambulatory Visit
Admission: RE | Admit: 2017-01-02 | Discharge: 2017-01-02 | Disposition: A | Discharge status: Home / Self Care | Payer: Managed Care, Other (non HMO) | Source: Ambulatory Visit | Attending: Sports Medicine | Admitting: Sports Medicine

## 2017-01-02 ENCOUNTER — Ambulatory Visit (INDEPENDENT_AMBULATORY_CARE_PROVIDER_SITE_OTHER): Payer: Managed Care, Other (non HMO) | Admitting: Sports Medicine

## 2017-01-02 VITALS — BP 118/58 | Ht 64.0 in | Wt 150.0 lb

## 2017-01-02 DIAGNOSIS — M25571 Pain in right ankle and joints of right foot: Secondary | ICD-10-CM

## 2017-01-02 DIAGNOSIS — R202 Paresthesia of skin: Secondary | ICD-10-CM

## 2017-01-02 NOTE — Progress Notes (Signed)
   Subjective:    Patient ID: Melissa Pierce, female    DOB: 1985/12/06, 31 y.o.   MRN: [id number]  HPI   Melissa Pierce comes in today for follow-up on bilateral foot numbness, right greater than left. She was able to complete her marathon but not without pain. She continues to endorse numbness and tingling that began in her toes bilaterally as she runs and her numbness will progress to involve the plantar aspect of the first MTP joint bilaterally. She denies numbness or tingling proximal to this. She denies numbness or tingling on the dorsum of her foot. Her symptoms are only noticeable when running long distances at a sustained speed such as several miles at a time. She does not have symptoms when running Spartan type races which are more of a stop and go. She has tried arch supports in the past but they were uncomfortable and actually made her symptoms worse. She thinks that 300 mg of gabapentin at night has been helpful. She tried to increase it to twice daily but did not tolerate the daytime drowsiness. A recent EMG/nerve conduction study was unremarkable. She did get some bilateral calf cramping during her marathon but that is unusual for her.    Review of Systems As above    Objective:   Physical Exam  Well-developed, well-nourished. No acute distress. Vital signs reviewed  Patient continues to show pes planus with standing. She continues to have a positive Tinel's over the tarsal tunnels bilaterally. Good strength. No atrophy. Sensation is intact to light touch grossly. Walking without a limp. Right ankle: 2+ talar tilt, positive anterior drawer. Left ankle: 1+ talar tilt, negative anterior drawer  X-rays of the more symptomatic right ankle are unremarkable. I see no accessory bones that could compress the tarsal tunnel.      Assessment & Plan:   Bilateral foot pain and numbness, right greater than left, likely secondary to tarsal tunnel syndrome Ankle instability  Although she has a  normal EMG/nerve conduction study and she denies numbness proximal to the forefoot, her positive Tinel's makes me very suspicious of tarsal tunnel syndrome. We discussed evaluating this further with an MRI of her more symptomatic right ankle. She would like to first check with her insurance company on cost before we do this. Her ankle instability may be playing a role in her symptoms so we will educate her on a home exercise program which consists of ankle strengthening. She was also shown a different way to lace her tennis shoes to help provide additional ankle support. Regardless of whether or not we decide to pursue the MRI I would like to see her back in the office in 4 weeks for reevaluation.

## 2017-01-06 ENCOUNTER — Encounter: Payer: Self-pay | Admitting: Obstetrics and Gynecology

## 2017-01-06 ENCOUNTER — Ambulatory Visit (INDEPENDENT_AMBULATORY_CARE_PROVIDER_SITE_OTHER): Payer: Managed Care, Other (non HMO) | Admitting: Obstetrics and Gynecology

## 2017-01-06 VITALS — BP 112/70 | Ht 64.0 in | Wt 154.0 lb

## 2017-01-06 DIAGNOSIS — Z3041 Encounter for surveillance of contraceptive pills: Secondary | ICD-10-CM

## 2017-01-06 DIAGNOSIS — Z124 Encounter for screening for malignant neoplasm of cervix: Secondary | ICD-10-CM

## 2017-01-06 DIAGNOSIS — Z1339 Encounter for screening examination for other mental health and behavioral disorders: Secondary | ICD-10-CM

## 2017-01-06 DIAGNOSIS — Z1331 Encounter for screening for depression: Secondary | ICD-10-CM

## 2017-01-06 DIAGNOSIS — Z1389 Encounter for screening for other disorder: Secondary | ICD-10-CM

## 2017-01-06 DIAGNOSIS — F909 Attention-deficit hyperactivity disorder, unspecified type: Secondary | ICD-10-CM | POA: Insufficient documentation

## 2017-01-06 DIAGNOSIS — Z01419 Encounter for gynecological examination (general) (routine) without abnormal findings: Secondary | ICD-10-CM

## 2017-01-06 LAB — HM PAP SMEAR: HM Pap smear: NORMAL

## 2017-01-06 MED ORDER — MICROGESTIN FE 1.5/30 1.5-30 MG-MCG PO TABS: ORAL_TABLET | ORAL | 4 refills | Status: DC

## 2017-01-06 NOTE — Progress Notes (Signed)
Gynecology Annual Exam  PCP: Saralyn PilarAlexander Karamalegos, DO  Chief Complaint  Patient presents with  . Annual Exam    History of Present Illness:  [name] is a 31 y.o. [phone] who LMP was Patient's last menstrual period was 12/23/2016., presents today for her annual examination.  Her menses are regular every 28-30 days, lasting 5 day(s).  Dysmenorrhea none. She does not have intermenstrual bleeding.  She is sexually active without issues.  Last Pap: 10/2013 - NILM. Hx of STDs: none  Last mammogram: n/a There is no FH of breast cancer. There is no FH of ovarian cancer. The patient does not do self-breast exams.  Tobacco use: The patient denies current or previous tobacco use. Alcohol use: social drinker Exercise: very active  She does get adequate calcium and Vitamin D in her diet.  The patient wears seatbelts: yes.      Review of Systems: Review of Systems  Constitutional: Negative.   HENT: Negative.   Eyes: Negative.   Respiratory: Negative.   Cardiovascular: Negative.   Gastrointestinal: Negative.   Genitourinary: Negative.   Musculoskeletal: Negative.   Skin: Negative.   Neurological: Negative.   Psychiatric/Behavioral: Negative.    Past Medical History:  Diagnosis Date  . ADHD   . Tarsal tunnel syndrome     Past Surgical History:  Procedure Laterality Date  . APPENDECTOMY    . CESAREAN SECTION    . WISDOM TOOTH EXTRACTION      Medications:   Medication Sig Start Date End Date Taking? Authorizing Provider  atomoxetine (STRATTERA) 80 MG capsule Take 80 mg by mouth daily.   Yes Historical Provider, MD  dextroamphetamine (DEXEDRINE SPANSULE) 15 MG 24 hr capsule Take 15 mg by mouth daily.   Yes Historical Provider, MD  gabapentin (NEURONTIN) 300 MG capsule Take 1 capsule (300 mg total) by mouth at bedtime. 10/18/16  Yes Ralene Corkimothy R Draper, DO  MICROGESTIN FE 1.5/30 1.5-30 MG-MCG tablet TK 1 T PO QD 12/20/16  Yes Conard NovakStephen D Jackson, MD  sertraline (ZOLOFT) 25 MG  tablet Take 25 mg by mouth daily.   Yes Historical Provider, MD   Allergies  Allergen Reactions  . Iodides Rash    Gynecologic History: Patient's last menstrual period was 12/23/2016.  Obstetric History: [id number]  Social History   Social History  . Marital status: Married    Spouse name: N/A  . Number of children: N/A  . Years of education: N/A   Occupational History  . Not on file.   Social History Main Topics  . Smoking status: Former Games developermoker  . Smokeless tobacco: Former NeurosurgeonUser  . Alcohol use Yes  . Drug use: No  . Sexual activity: Yes    Birth control/ protection: Pill   Other Topics Concern  . Not on file   Social History Narrative  . No narrative on file   Family History  Problem Relation Age of Onset  . Heart disease Father   . Diabetes Father      Physical Exam BP 112/70   Ht 5\' 4"  (1.626 m)   Wt 154 lb (69.9 kg)   LMP 12/23/2016   BMI 26.43 kg/m   OBGyn Exam   Female chaperone present for pelvic and breast  portions of the physical exam  Results: AUDIT Questionnaire (screen for alcoholism): 6 PHQ-9: 1   Assessment: 31 y.o. [id number] for annual gynecologic exam and contraceptive management.  Plan: Problem List Items Addressed This Visit    None    Visit Diagnoses  Women's annual routine gynecological examination    -  Primary   Relevant Medications   MICROGESTIN FE 1.5/30 1.5-30 MG-MCG tablet   Other Relevant Orders   IGP, Aptima HPV, rfx 16/18,45   Pap smear for cervical cancer screening       Relevant Medications   MICROGESTIN FE 1.5/30 1.5-30 MG-MCG tablet   Other Relevant Orders   IGP, Aptima HPV, rfx 16/18,45   Screening for depression       low risk PHQ-9 evaluation   Screening for alcohol problem       Audit Questionnaire: low risk   Encounter for surveillance of contraceptive pills       refil Rx for microgestin. Change to 76-month supply.    Relevant Medications   MICROGESTIN FE 1.5/30 1.5-30 MG-MCG tablet     1)  Contraception: No issues currently. Continue current medication.  2) STI screening was offered and declined. Patient is low-risk.  3) Pap - ASCCP guidelines and rational discussed.  Patient opts for routine screening interval. Performed today.  4) Routine healthcare maintenance including cholesterol, diabetes screening. None indicated today given age and risk factors.  Return in about 1 year (around 01/06/2018) for annual exam with Dr. Jean Rosenthal.   Thomasene Mohair, MD 01/06/2017 2:40 PM

## 2017-01-10 LAB — IGP, APTIMA HPV, RFX 16/18,45
HPV Aptima: NEGATIVE
PAP Smear Comment: 0

## 2017-01-11 ENCOUNTER — Other Ambulatory Visit: Payer: Self-pay | Admitting: *Deleted

## 2017-01-11 ENCOUNTER — Encounter: Payer: Self-pay | Admitting: Obstetrics and Gynecology

## 2017-01-11 DIAGNOSIS — M25571 Pain in right ankle and joints of right foot: Secondary | ICD-10-CM

## 2017-01-17 ENCOUNTER — Other Ambulatory Visit: Payer: Self-pay | Admitting: Obstetrics and Gynecology

## 2017-01-30 ENCOUNTER — Ambulatory Visit: Payer: Managed Care, Other (non HMO) | Admitting: Sports Medicine

## 2017-02-13 ENCOUNTER — Ambulatory Visit: Payer: Managed Care, Other (non HMO) | Admitting: Sports Medicine

## 2017-02-13 ENCOUNTER — Other Ambulatory Visit: Payer: Self-pay | Admitting: *Deleted

## 2017-02-13 DIAGNOSIS — R202 Paresthesia of skin: Secondary | ICD-10-CM

## 2017-02-13 MED ORDER — GABAPENTIN 300 MG PO CAPS: 300.0000 mg | ORAL_CAPSULE | Freq: Every day | ORAL | 1 refills | Status: DC

## 2017-02-14 DIAGNOSIS — M67471 Ganglion, right ankle and foot: Secondary | ICD-10-CM

## 2017-02-14 DIAGNOSIS — M67472 Ganglion, left ankle and foot: Secondary | ICD-10-CM

## 2017-02-14 HISTORY — DX: Ganglion, left ankle and foot: M67.472

## 2017-02-14 HISTORY — DX: Ganglion, right ankle and foot: M67.471

## 2017-02-21 ENCOUNTER — Ambulatory Visit
Admission: RE | Admit: 2017-02-21 | Discharge: 2017-02-21 | Disposition: A | Discharge status: Home / Self Care | Payer: Managed Care, Other (non HMO) | Source: Ambulatory Visit | Attending: Sports Medicine | Admitting: Sports Medicine

## 2017-02-21 DIAGNOSIS — M25871 Other specified joint disorders, right ankle and foot: Secondary | ICD-10-CM | POA: Insufficient documentation

## 2017-02-21 DIAGNOSIS — M25571 Pain in right ankle and joints of right foot: Secondary | ICD-10-CM | POA: Diagnosis present

## 2017-02-22 ENCOUNTER — Telehealth: Payer: Self-pay | Admitting: Sports Medicine

## 2017-02-22 NOTE — Telephone Encounter (Signed)
I spoke with Melissa Pierce on the phone today after reviewing the MRI of her right ankle. Dominant finding is a cystic lesion in the deep plantar soft tissues contiguous with the second and third tarsometatarsal joints compatible with a ganglion cyst. No evidence of tarsal tunnel. I believe that this is the source of her foot discomfort. She will follow-up with me in the office next week to discuss these findings further and to discuss future treatment.

## 2017-02-23 ENCOUNTER — Encounter: Payer: Self-pay | Admitting: Sports Medicine

## 2017-02-28 ENCOUNTER — Encounter: Payer: Self-pay | Admitting: Sports Medicine

## 2017-02-28 ENCOUNTER — Ambulatory Visit (INDEPENDENT_AMBULATORY_CARE_PROVIDER_SITE_OTHER): Payer: Managed Care, Other (non HMO) | Admitting: Sports Medicine

## 2017-02-28 VITALS — BP 99/42 | Ht 64.0 in | Wt 145.0 lb

## 2017-02-28 DIAGNOSIS — R202 Paresthesia of skin: Secondary | ICD-10-CM

## 2017-02-28 DIAGNOSIS — M6740 Ganglion, unspecified site: Secondary | ICD-10-CM

## 2017-02-28 NOTE — Progress Notes (Signed)
  Patient comes in today to discuss MRI findings of her right ankle. There is no lesion in the tarsal tunnel but she has a cystic lesion in the deep plantar soft tissues adjacent to the second and third tarsometatarsal joints. I think this is the cause of her symptoms. I had a long talk with the patient regarding treatment options. At this point in time she would like to meet with an orthopedist to discuss merits of surgical excision. She does understand that excision does not mean that the cyst may not return at a later date. She also understands that this is likely something she could simply just live with as she has done over the past several years. I will refer her to Dr. Victorino DikeHewitt for his input. Patient is encouraged to call me with any questions or concerns that she may have after that consultation. In the meantime, I think she is okay to continue with all activity including running using pain as her guide.  Total time spent with the patient was 15 minutes with greater than 50% of the time spent in face-to-face consultation discussing her diagnosis, MRI findings, and treatment going forward.

## 2017-03-02 ENCOUNTER — Encounter: Payer: Self-pay | Admitting: *Deleted

## 2017-03-02 NOTE — Patient Instructions (Signed)
Dr Berton BonHewitt  [facility] 04/12/17 at 845a

## 2017-06-13 ENCOUNTER — Other Ambulatory Visit: Payer: Self-pay

## 2017-06-13 DIAGNOSIS — R202 Paresthesia of skin: Secondary | ICD-10-CM

## 2017-06-13 MED ORDER — GABAPENTIN 300 MG PO CAPS: 300.0000 mg | ORAL_CAPSULE | Freq: Every day | ORAL | 1 refills | Status: DC

## 2017-08-23 ENCOUNTER — Ambulatory Visit (INDEPENDENT_AMBULATORY_CARE_PROVIDER_SITE_OTHER): Payer: Managed Care, Other (non HMO) | Admitting: Family Medicine

## 2017-08-23 ENCOUNTER — Encounter: Payer: Self-pay | Admitting: Family Medicine

## 2017-08-23 VITALS — BP 122/66 | HR 72 | Temp 98.5°F | Resp 16 | Ht 64.0 in | Wt 152.0 lb

## 2017-08-23 DIAGNOSIS — J0110 Acute frontal sinusitis, unspecified: Secondary | ICD-10-CM | POA: Diagnosis not present

## 2017-08-23 DIAGNOSIS — J209 Acute bronchitis, unspecified: Secondary | ICD-10-CM | POA: Diagnosis not present

## 2017-08-23 MED ORDER — AZITHROMYCIN 250 MG PO TABS: ORAL_TABLET | ORAL | 0 refills | Status: DC

## 2017-08-23 MED ORDER — IPRATROPIUM BROMIDE 0.06 % NA SOLN: 2.0000 | Freq: Four times a day (QID) | NASAL | 0 refills | Status: DC

## 2017-08-23 MED ORDER — FLUTICASONE PROPIONATE 50 MCG/ACT NA SUSP: 2.0000 | Freq: Every day | NASAL | 3 refills | Status: DC

## 2017-08-23 NOTE — Progress Notes (Signed)
Subjective:    Patient ID: Melissa Pierce, female    DOB: [birth year], 31 y.o.   MRN: [id number]  Melissa Pierce is a 31 y.o. female presenting on 08/23/2017 for Sore Throat (sinus pressure, HA, facial pressure cough and chest congestion onset week both kid sick and Daughter was diagnosed strep use she used MDLIVE and Rx amoxicillin )  Patient presents for a same day appointment.  HPI   SINUSITIS / BRONCHITIS Reports symptoms started 1 week ago with symptoms of sore throat and cold symptoms, she called MD live and dx with Amoxicillin 500mg  TID x 7 days, then thought symptoms worsened with sinus issues and [place], and seemed to worsen over past 3-4 days, had some severe shooting sinus headaches worse with the airplane descent, and now worsening into chest with more chest tightness congestion and occasionally productive vs croup cough, also still sore throat with swelling - Tried OTC Mucinex-DM (still taking), Vitamin C supplement, Loratadine OTC - Her daughter age 377 was dx with acute strep, after having URI symptoms sore throat but also with sinus symptoms, and she had rapid strep negative - Also sick contact with son age 744, with symptoms of croupy cough - Admits frontal sinus pain and pressure bilateral, seems not improved - Denies any fevers/chills, sweats, abdominal pain, nausea vomiting, diarrhea  Health Maintenance: - Due for Flu Shot, defer due to acute illness  [facility]/02[facility]2017 08/09/2016  Decreased Interest 0 0  Down, Depressed, Hopeless 0 0  PHQ - 2 Score 0 0    Social History   Tobacco Use  . Smoking status: Former Games developermoker  . Smokeless tobacco: Former Engineer, waterUser  Substance Use Topics  . Alcohol use: Yes  . Drug use: No    Review of Systems Per HPI unless specifically indicated above     Objective:    BP 122/66 (BP Location: Left Arm, Patient Position: Sitting, Cuff Size: Normal)   Pulse 72   Temp 98.5 F (36.9 C) (Oral)   Resp  16   Ht 5\' 4"  (1.626 m)   Wt 152 lb (68.9 kg)   SpO2 99%   BMI 26.09 kg/m   Wt Readings from Last 3 Encounters:  08/23/17 152 lb (68.9 kg)  02/28/17 145 lb (65.8 kg)  01/06/17 154 lb (69.9 kg)    Physical Exam  Constitutional: She is oriented to person, place, and time. She appears well-developed and well-nourished. No distress.  Mostly well but tired and slightly ill-appearing, mildly uncomfortable with sinus pressure, cooperative  HENT:  Head: Normocephalic and atraumatic.  Mouth/Throat: Oropharynx is clear and moist.  Frontal bilateral sinuses mild tender. Nares patent with some deeper turbinate edema without purulence. Bilateral TMs with clear effusion R>L without erythema. Oropharynx posteriorly with mild drainage with minimal erythema, without tonsillar edema, without exudates, edema or asymmetry.  Eyes: Conjunctivae are normal. Right eye exhibits no discharge. Left eye exhibits no discharge.  Neck: Normal range of motion. Neck supple. No thyromegaly present.  Tender bilateral anterior cervical  Cardiovascular: Normal rate, regular rhythm, normal heart sounds and intact distal pulses.  No murmur heard. Pulmonary/Chest: Effort normal and breath sounds normal. No respiratory distress. She has no wheezes. She has no rales.  Musculoskeletal: Normal range of motion. She exhibits no edema.  Lymphadenopathy:    She has no cervical adenopathy.  Neurological: She is alert and oriented to person, place, and time.  Skin: Skin is warm and dry. No rash noted. She  is not diaphoretic. No erythema.  Psychiatric: Her behavior is normal.  Nursing note and vitals reviewed.     Assessment & Plan:   Problem List Items Addressed This Visit    None    Visit Diagnoses    Acute non-recurrent frontal sinusitis    -  Primary   Relevant Medications   azithromycin (ZITHROMAX Z-PAK) 250 MG tablet   ipratropium (ATROVENT) 0.06 % nasal spray   fluticasone (FLONASE) 50 MCG/ACT nasal spray   Acute  bronchitis, unspecified organism       Relevant Medications   azithromycin (ZITHROMAX Z-PAK) 250 MG tablet  Consistent with subacute >1 week frontal sinusitis, likely initially viral URI vs viral pharyngitis with possible allergic rhinitis component with worsening concern for bacterial infection now with significant sinus pain nd second sickening. - Reassuring with afebrile, and no other focal sign of infection (lungs clear, throat reassuring, ears effusion only no AOM) - S/p Amox 500 TID x 7 days with strep exposure from daughter, but symptoms not consistent with strep, this seemed to have been too early into course now symptoms worsened  Plan: 1. Discussed antibiotic options vs supportive care, agree trial of alternative antibiotic approach also since some bronchitis now too with chest congestion - Start Azithromycin Z pak dosing (considered Levaquin for both sinus/chest but avoid due to high risk of tendon rupture injury in active marathon runner) 2. Start Atrovent nasal spray decongestant 2 sprays in each nostril up to 4 times daily for 7 days 3. Start nasal steroid Flonase 2 sprays in each nostril daily for 4-6 weeks, may repeat course seasonally or as needed 5. Resume Loratadine (Claritin) 10mg  daily 6. Supportive care with nasal saline OTC, hydration, warm compresses and sinus effleurage as demonstrated 7. Return criteria reviewed       Meds ordered this encounter  Medications  . DISCONTD: amoxicillin (AMOXIL) 500 MG capsule    Sig: TK 1 C PO TID    Refill:  0  . azithromycin (ZITHROMAX Z-PAK) 250 MG tablet    Sig: Take 2 tabs (500mg  total) on Day 1. Take 1 tab (250mg ) daily for next 4 days.    Dispense:  6 tablet    Refill:  0  . ipratropium (ATROVENT) 0.06 % nasal spray    Sig: Place 2 sprays 4 (four) times daily into both nostrils. For up to 5-7 days then stop.    Dispense:  15 mL    Refill:  0  . fluticasone (FLONASE) 50 MCG/ACT nasal spray    Sig: Place 2 sprays daily  into both nostrils. Use for 4-6 weeks then stop and use seasonally or as needed.    Dispense:  16 g    Refill:  3      Follow up plan: Return in about 1 week (around 08/30/2017), or if symptoms worsen or fail to improve, for sinus/bronchitis worsening.  Saralyn PilarAlexander Wyllow Seigler, DO [facility] 08/23/2017, 12:12 PM

## 2017-08-23 NOTE — Patient Instructions (Addendum)
Thank you for coming to the clinic today.  1.  It sounds like you have a Sinusitis (Bacterial Infection) - this most likely started as an Upper Respiratory Virus that has settled into an infection. Likely started with a viral infection to begin with, also may have developing bronchitis  - Start Azithromycin antibiotic   Start Atrovent nasal spray decongestant 2 sprays in each nostril up to 4 times daily for 7 days  Start nasal steroid Flonase 2 sprays in each nostril daily for 4-6 weeks, may repeat course seasonally or as needed  Continue Loratadine (Claritin) 10mg  daily  Mucinex DM regularly for 7-10 days  - Recommend to keep using Nasal Saline spray multiple times a day to help flush out congestion and clear sinuses - Improve hydration by drinking plenty of clear fluids (water, gatorade) to reduce secretions and thin congestion - Congestion draining down throat can cause irritation. May try warm herbal tea with honey, cough drops - Can take Tylenol or Ibuprofen as needed for fevers  If you develop persistent fever >101F for at least 3 consecutive days, headaches with sinus pain or pressure or persistent earache, please schedule a follow-up evaluation within next few days to week.  Please schedule a Follow-up Appointment to: Return in about 1 week (around 08/30/2017), or if symptoms worsen or fail to improve, for sinus/bronchitis worsening.  If you have any other questions or concerns, please feel free to call the clinic or send a message through MyChart. You may also schedule an earlier appointment if necessary.  Additionally, you may be receiving a survey about your experience at our clinic within a few days to 1 week by e-mail or mail. We value your feedback.  Saralyn PilarAlexander Karamalegos, DO [facility]

## 2017-11-10 ENCOUNTER — Encounter: Payer: Self-pay | Admitting: Family Medicine

## 2017-11-10 ENCOUNTER — Other Ambulatory Visit: Payer: Self-pay

## 2017-11-10 ENCOUNTER — Ambulatory Visit (INDEPENDENT_AMBULATORY_CARE_PROVIDER_SITE_OTHER): Payer: Managed Care, Other (non HMO) | Admitting: Family Medicine

## 2017-11-10 VITALS — BP 102/57 | HR 74 | Temp 97.8°F | Ht 64.0 in | Wt 150.8 lb

## 2017-11-10 DIAGNOSIS — H8110 Benign paroxysmal vertigo, unspecified ear: Secondary | ICD-10-CM

## 2017-11-10 NOTE — Patient Instructions (Addendum)
Thank you for coming to the office today.  1. You have symptoms of Vertigo (Benign Paroxysmal Positional Vertigo) - This is commonly caused by inner ear fluid imbalance, sometimes can be worsened by allergies and sinus symptoms, otherwise it can occur randomly sometimes and we may never discover the exact cause. - To treat this, try the Epley Manuever (see diagrams/instructions below) at home up to 3 times a day for 1-2 weeks or until symptoms resolve - You may take Meclizine(Antivert) as needed up to 3 times a day for dizziness, this will not cure symptoms but may help. Caution may make you drowsy.  If you develop significant worsening episode with vertigo that does not improve and you get severe headache, loss of vision, arm or leg weakness, slurred speech, or other concerning symptoms please seek immediate medical attention at Emergency Department.  Give me an update within 1-2 weeks, if still persistent, we can consider an empiric Antibiotic.  Please schedule a follow-up appointment with Dr Althea CharonKaramalegos within 4 weeks if Vertigo not improving, and will consider Referral to Vestibular Rehab  See the next page for images describing the Epley Manuever.     ----------------------------------------------------------------------------------------------------------------------      Please schedule a Follow-up Appointment to: Return in about 2 weeks (around 11/24/2017), or if symptoms worsen or fail to improve, for sinus vertigo.   If you have any other questions or concerns, please feel free to call the office or send a message through MyChart. You may also schedule an earlier appointment if necessary.  Additionally, you may be receiving a survey about your experience at our office within a few days to 1 week by e-mail or mail. We value your feedback.  Saralyn PilarAlexander Maxon Kresse, DO [facility], [facility]

## 2017-11-10 NOTE — Progress Notes (Signed)
Subjective:    Patient ID: Melissa Pierce, female    DOB: Dec 22, 1985, 32 y.o.   MRN: [id number]  Melissa Pierce is a 32 y.o. female presenting on 11/10/2017 for Dizziness (intermittent sleepy spells, off balance x 8-9 days )   HPI   DIZZINESS / VERTIGO Reports symptoms started about 7-8 days ago she was at work in middle of day or any time of day she will get brief episodes that last few seconds only, are spontaneous, do not seem worse but not going away, describes feeling head and sinus pressure with a "wave" across her head and feels like a "brain squeeze" and she will get sleepy dizzy, and feels "off balance" and "room spinning" at times, and at once she was driving and had the sensation and felt like she was "falling" and a few times she feels like the room is moving and spinning. Husband had "deeper sinus" infection in past and required a CT scan in past to diagnose it, he was unsure if this was related and wanted her to come into doctor - No known sick contacts - Not taking any medicine OTC for this - She admits feeling tired and sleepy at times, but is still awake - She has remote history of some migraines in past, triggered by artificial light and other factors, but these have been controlled and without significant problems in a while. - Admits some frontal pressure at times band-like across eye area - Denies any fevers/chills, numbness tingling, weakness, nausea vomiting, headache  Health Maintenance: Due for Flu Shot, declines today despite counseling on benefits   Depression screen [facility] 2/9 01/02/2017 08/09/2016  Decreased Interest 0 0  Down, Depressed, Hopeless 0 0  PHQ - 2 Score 0 0    Social History   Tobacco Use  . Smoking status: Former Games developer  . Smokeless tobacco: Former Engineer, water Use Topics  . Alcohol use: Yes  . Drug use: No    Review of Systems Per HPI unless specifically indicated above     Objective:    BP (!) 102/57 (BP Location: Right Arm, Patient  Position: Sitting, Cuff Size: Normal)   Pulse 74   Temp 97.8 F (36.6 C) (Oral)   Ht 5\' 4"  (1.626 m)   Wt 150 lb 12.8 oz (68.4 kg)   BMI 25.88 kg/m   Wt Readings from Last 3 Encounters:  11/10/17 150 lb 12.8 oz (68.4 kg)  08/23/17 152 lb (68.9 kg)  02/28/17 145 lb (65.8 kg)    Physical Exam  Constitutional: She is oriented to person, place, and time. She appears well-developed and well-nourished. No distress.  Well-appearing, comfortable, cooperative  HENT:  Head: Normocephalic and atraumatic.  Mouth/Throat: Oropharynx is clear and moist.  Frontal / maxillary sinuses non-tender. Nares patent without purulence or edema. Bilateral TMs clear without erythema, effusion or bulging. Oropharynx clear without erythema, exudates, edema or asymmetry.  Dix-Hallpike Maneuver performed bilateral without any nystagmus or vertigo symptoms reproduced.  Eyes: Conjunctivae are normal. Right eye exhibits no discharge. Left eye exhibits no discharge.  Neck: Normal range of motion. Neck supple.  Cardiovascular: Normal rate, regular rhythm, normal heart sounds and intact distal pulses.  No murmur heard. Pulmonary/Chest: Effort normal and breath sounds normal. No respiratory distress. She has no wheezes. She has no rales.  Musculoskeletal: Normal range of motion. She exhibits no edema.  Distal strength intact bilateral extremities  Lymphadenopathy:    She has no cervical adenopathy.  Neurological: She is alert and oriented  to person, place, and time.  Distal sensation intact to light touch  Skin: Skin is warm and dry. No rash noted. She is not diaphoretic. No erythema.  Psychiatric: She has a normal mood and affect. Her behavior is normal.  Well groomed, good eye contact, normal speech and thoughts  Nursing note and vitals reviewed.        Assessment & Plan:   Problem List Items Addressed This Visit    None    Visit Diagnoses    Benign paroxysmal positional vertigo, unspecified laterality     -  Primary  Suspected BPPV by history with vertigo spells - No other significant neurological findings or focal deficits - Afebrile, no focal sign of sinus or ear infection. No viral symptoms - H/o migraines but remote and seems unrelated  Plan: 1. Handout given with Epley maneuver TID for 1-2 weeks until resolved 2. Consider OTC meclizine PRN for breakthrough symptoms 3. Return criteria, if not improved consider vestibular PT referral vs trial empiric antibiotics Augmentin course for possible sinus infection that is developing, given her concern  Additionally patient has not had labs or annual physical, advised her that she should schedule for this in future to get baseline blood work.      No orders of the defined types were placed in this encounter.     Follow up plan: Return in about 2 weeks (around 11/24/2017), or if symptoms worsen or fail to improve, for sinus vertigo.  Saralyn PilarAlexander Karamalegos, DO [facility] 11/10/2017, 8:37 AM

## 2017-12-05 ENCOUNTER — Other Ambulatory Visit: Payer: Self-pay

## 2017-12-05 DIAGNOSIS — R202 Paresthesia of skin: Secondary | ICD-10-CM

## 2017-12-05 MED ORDER — GABAPENTIN 300 MG PO CAPS: 300.0000 mg | ORAL_CAPSULE | Freq: Every day | ORAL | 0 refills | Status: DC

## 2018-02-06 ENCOUNTER — Other Ambulatory Visit: Payer: Self-pay | Admitting: *Deleted

## 2018-02-06 MED ORDER — GABAPENTIN 300 MG PO CAPS: 300.0000 mg | ORAL_CAPSULE | Freq: Every day | ORAL | 0 refills | Status: DC

## 2018-03-05 ENCOUNTER — Encounter: Payer: Self-pay | Admitting: Family Medicine

## 2018-03-05 ENCOUNTER — Ambulatory Visit: Payer: Self-pay | Admitting: Family Medicine

## 2018-03-05 ENCOUNTER — Ambulatory Visit (INDEPENDENT_AMBULATORY_CARE_PROVIDER_SITE_OTHER): Payer: Managed Care, Other (non HMO) | Admitting: Family Medicine

## 2018-03-05 VITALS — BP 112/69 | HR 67 | Temp 98.4°F | Resp 16 | Ht 64.0 in

## 2018-03-05 DIAGNOSIS — M545 Low back pain, unspecified: Secondary | ICD-10-CM

## 2018-03-05 MED ORDER — CYCLOBENZAPRINE HCL 10 MG PO TABS: 5.0000 mg | ORAL_TABLET | Freq: Three times a day (TID) | ORAL | 0 refills | Status: DC | PRN

## 2018-03-05 MED ORDER — PREDNISONE 20 MG PO TABS: ORAL_TABLET | ORAL | 0 refills | Status: DC

## 2018-03-05 NOTE — Progress Notes (Signed)
Subjective:    Patient ID: Melissa Pierce, female    DOB: [birth year], 32 y.o.   MRN: [id number]  Melissa Pierce is a 32 y.o. female presenting on 03/05/2018 for Back Pain (onset yesterday )  Patient presents for a same day appointment. Patient provides most of history, she is accompanied by her husband, Nida Boatman who provides additional history.  HPI   LOW BACK PAIN, Acute - Reports symptoms started about 24 hours ago with possible inciting injury bending forward to pick up a light weight 3 lb cutting board, unsure if twisting, she did not squat down to pick it up, felt an acute sharp pain, R more midline spine, initially had some L sided pain. She could not get up, she laid on ground on stomach and then eventually  - Additionally, prior flare up in past when lifting weights will get sore but never this painful, and may have onset few days later last few days then resolve   Today seems to be worsening. Describes pain as acute still sharp constant, severity initially 10/10 with some mild improvement down to about 8/10 with some radiating across bilateral abdomen, intermittent worsening with walking will have muscle spasms or wrong position. Does have some feeling of dullness in both legs down into feet with some tingling. No pain radiating to legs. - Taking Ibuprofen  x 2 for  and taking Tylenol x 2 every 4 hours - Taking Gabapentin  x 1 about 12 hours ago and another this morning with some relief - has gabapentin due to cysts in her feet - Tried icing of back and icy hot topical - temporary relief - Prior history of back injury approx age 59, she feels similar issue, "tweaked" her back similar way, she was diving off diving board backdive and had an issue, chiropractor and rest and it improved - No prior imaging of back. No prior dx OA/DJD or surgery - Admits able to sleep overall but had some discomfort - Denies any fevers/chills, numbness, tingling, weakness, loss of control  bladder/bowel incontinence or retention, unintentional wt loss, night sweats  Straight leg raise, R provoked pain, no radicular Left hypertonicity, localized pain not reproduced   Depression screen [facility] 2/9 03/05/2018 01/02/2017 08/09/2016  Decreased Interest 0 0 0  Down, Depressed, Hopeless 0 0 0  PHQ - 2 Score 0 0 0    Social History   Tobacco Use  . Smoking status: Former Games developer  . Smokeless tobacco: Former Engineer, water Use Topics  . Alcohol use: Yes  . Drug use: No    Review of Systems Per HPI unless specifically indicated above     Objective:    BP 112/69   Pulse 67   Temp 98.4 F (36.9 C) (Oral)   Resp 16   Ht  (1.626 m) Comment: 5 4  BMI 25.88 kg/m   Wt Readings from Last 3 Encounters:  11/10/17 150 lb 12.8 oz (68.4 kg)  08/23/17 152 lb (68.9 kg)  02/28/17 145 lb (65.8 kg)    Physical Exam  Constitutional: She is oriented to person, place, and time. She appears well-developed and well-nourished. No distress.  Well-appearing, comfortable, cooperative  HENT:  Head: Normocephalic and atraumatic.  Mouth/Throat: Oropharynx is clear and moist.  Eyes: Conjunctivae are normal. Right eye exhibits no discharge. Left eye exhibits no discharge.  Neck: Normal range of motion. Neck supple.  Cardiovascular: Normal rate and intact distal pulses.  Pulmonary/Chest: Effort normal and breath sounds normal. No respiratory distress.  Musculoskeletal: She exhibits no edema.  Low Back Inspection: Normal appearance, no spinal deformity, symmetrical. Palpation: No tenderness over spinous processes. Left mid to lower lumbar paraspinal muscles with hypertonicity and spasm, non tender on exam. ROM: Significantly limited active ROM forward flex / back extension, rotation L/R due to pain in low back. Special Testing: Seated SLR with reproduced localized R >L low back pain but negative for radicular pain. Seated facet load test positive R > L low back pain Strength: Bilateral hip  flex/ext 5/5, knee flex/ext 5/5, ankle dorsiflex/plantarflex 5/5 Neurovascular: intact distal sensation to light touch   Neurological: She is alert and oriented to person, place, and time.  Skin: Skin is warm and dry. No rash noted. She is not diaphoretic. No erythema.  Psychiatric: She has a normal mood and affect. Her behavior is normal.  Well groomed, good eye contact, normal speech and thoughts  Nursing note and vitals reviewed.      Assessment & Plan:   Problem List Items Addressed This Visit    None    Visit Diagnoses    Acute bilateral low back pain without sciatica    -  Primary   Relevant Medications   predniSONE (DELTASONE) 20 MG tablet   cyclobenzaprine (FLEXERIL) 10 MG tablet      Acute bilateral R>L LBP without associated sciatica. Suspect likely due to muscle spasm/strain, with possible known injury with straining or stretching due to acute bending forward, also very active with exercise running and may have caused other injury recently - No known chronic LBP or prior diagnosis of DJD, no prior surgery. She has had minor flare ups in past, one similar >15-16 years ago - No red flag symptoms. Negative SLR for radiculopathy, except acute pain is concerning. - Mild improvement on conservative therapy  Plan: 1. START Prednisone taper 7 days - as prescribed - HOLD NSAID until finished - then may restart OTC Ibuprofen PRN - May continue current Gabapentin regularly 2. Start muscle relaxant with Flexeril  tabs - take 5-10mg  up to TID PRN, titrate up as tolerated, caution sedation - may only take at night if prefer 3. Start Tylenol  x 2 per dose TID regularly then PRN 4. Encouraged use of ice packs for few more days acute pain then switch to heating pad 1-2x daily for now then PRN 5. Avoid re-injury, caution with returning to marathons and races, emphasis on relative rest 6. Follow-up if not improved 4 weeks, consider X-ray vs future PT, chiropractor    Meds  ordered this encounter  Medications  . predniSONE (DELTASONE) 20 MG tablet    Sig: Take daily with food. Start with  (3 pills) x 2 days, then reduce to  (2 pills) x 2 days, then  (1 pill) x 3 days    Dispense:  13 tablet    Refill:  0  . cyclobenzaprine (FLEXERIL) 10 MG tablet    Sig: Take 0.5-1 tablets (5-10 mg total) by mouth 3 (three) times daily as needed for muscle spasms. If too sedating, may only take at bedtime    Dispense:  30 tablet    Refill:  0    Follow up plan: Return in about 1 month (around 04/02/2018) for Back Pain.  Saralyn Pilar, DO [facility] 03/05/2018, 9:59 AM

## 2018-03-05 NOTE — Patient Instructions (Addendum)
Thank you for coming to the office today.  1. For your Back Pain - I think that this is due to Muscle Spasms or strain. Your Sciatic Nerve can be affected causing some of your radiation and numbness down your legs.  Start Prednisone taper over next 7 days - follow instructions - This takes place of an Anti inflammatory - may STOP Ibuprofen at this time, until you finish the Prednisone  Start Cyclobenzapine (Flexeril)  tablets - cut in half for  at night for muscle relaxant - may make you sedated or sleepy (be careful driving or working on this) if tolerated you can take every 8 hours, half or whole tab  Recommend to start taking Tylenol Extra Strength  tabs - take 1 to 2 tabs per dose (max ) every 6-8 hours for pain (take regularly, don't skip a dose for next 7 days), max 24 hour daily dose is 6 tablets or . In the future you can repeat the same everyday Tylenol course for 1-2 weeks at a time.   May continue topical ice and then eventually more heat, icy hot  This pain may take weeks to months to fully resolve, but hopefully it will respond to the medicine initially. All back injuries (small or serious) are slow to heal since we use our back muscles every day. Be careful with turning, twisting, lifting, sitting / standing for prolonged periods, and avoid re-injury.  If your symptoms significantly worsen with more pain, or new symptoms with weakness in one or both legs, new or different shooting leg pains, numbness in legs or groin, loss of control or retention of urine or bowel movements, please call back for advice and you may need to go directly to the Emergency Department.  In future we can consider X-ray if still pain at 4 weeks or may need future physical therapy or chiropractor  Please schedule a Follow-up Appointment to: Return in about 1 month (around 04/02/2018) for Back Pain.  If you have any other questions or concerns, please feel free to call the office or  send a message through MyChart. You may also schedule an earlier appointment if necessary.  Additionally, you may be receiving a survey about your experience at our office within a few days to 1 week by e-mail or mail. We value your feedback.  Saralyn Pilar, DO [facility], [place]

## 2018-03-14 ENCOUNTER — Other Ambulatory Visit: Payer: Self-pay | Admitting: Obstetrics and Gynecology

## 2018-03-14 DIAGNOSIS — Z124 Encounter for screening for malignant neoplasm of cervix: Secondary | ICD-10-CM

## 2018-03-14 DIAGNOSIS — Z3041 Encounter for surveillance of contraceptive pills: Secondary | ICD-10-CM

## 2018-03-14 DIAGNOSIS — Z01419 Encounter for gynecological examination (general) (routine) without abnormal findings: Secondary | ICD-10-CM

## 2018-03-19 ENCOUNTER — Ambulatory Visit (INDEPENDENT_AMBULATORY_CARE_PROVIDER_SITE_OTHER): Payer: Managed Care, Other (non HMO) | Admitting: Obstetrics and Gynecology

## 2018-03-19 ENCOUNTER — Encounter: Payer: Self-pay | Admitting: Obstetrics and Gynecology

## 2018-03-19 ENCOUNTER — Other Ambulatory Visit: Payer: Self-pay

## 2018-03-19 VITALS — BP 110/68 | HR 71 | Ht 64.0 in | Wt 146.0 lb

## 2018-03-19 DIAGNOSIS — Z01419 Encounter for gynecological examination (general) (routine) without abnormal findings: Secondary | ICD-10-CM | POA: Diagnosis not present

## 2018-03-19 DIAGNOSIS — Z1339 Encounter for screening examination for other mental health and behavioral disorders: Secondary | ICD-10-CM

## 2018-03-19 DIAGNOSIS — Z0000 Encounter for general adult medical examination without abnormal findings: Secondary | ICD-10-CM

## 2018-03-19 DIAGNOSIS — Z3041 Encounter for surveillance of contraceptive pills: Secondary | ICD-10-CM

## 2018-03-19 DIAGNOSIS — Z803 Family history of malignant neoplasm of breast: Secondary | ICD-10-CM

## 2018-03-19 DIAGNOSIS — Z1331 Encounter for screening for depression: Secondary | ICD-10-CM | POA: Diagnosis not present

## 2018-03-19 MED ORDER — MICROGESTIN FE 1.5/30 1.5-30 MG-MCG PO TABS: 1.0000 | ORAL_TABLET | Freq: Every day | ORAL | 4 refills | Status: DC

## 2018-03-19 NOTE — Progress Notes (Signed)
Gynecology Annual Exam  PCP: Olin Hauser, DO  Chief Complaint  Patient presents with  . Gynecologic Exam    No complaints   History of Present Illness:  [name] is a 32 y.o. [phone] who LMP was Patient's last menstrual period was 03/13/2018., presents today for her annual examination.  Her menses are regular every 28-30 days, lasting 5 day(s).  Dysmenorrhea none. She does not have intermenstrual bleeding.  She is sexually active without issues.  Last Pap: 1 year ago.  Results were: no abnormalities /neg HPV DNA negative Hx of STDs: none  Last mammogram:  n/a There is a family history of breast cancer in her cousin (paternal, daughter of paternal aunt), paternal aunt. There is no FH of ovarian cancer. The patient does not do self-breast exams.  S/p BRCA testing, which was negative.   Tobacco use: The patient denies current or previous tobacco use. Alcohol use: social drinker Exercise: very active  She does get adequate calcium and Vitamin D in her diet.  The patient wears seatbelts: yes.      Past Medical History:  Diagnosis Date  . ADHD   . Dysmenorrhea   . Ganglion of foot, left 02/14/2017  . Ganglion of foot, right 02/14/2017  . Tarsal tunnel syndrome    Determined only ganglian cyst   Past Surgical History:  Procedure Laterality Date  . APPENDECTOMY    . APPENDECTOMY  02/22/2012  . CESAREAN SECTION    . CESAREAN SECTION  06/12/2013  . WISDOM TOOTH EXTRACTION      Prior to Admission medications   Medication Sig Start Date End Date Taking? Authorizing Provider  Ascorbic Acid (VITAMIN C) 100 MG tablet Take 100 mg by mouth daily.   Yes [provider]  atomoxetine (STRATTERA) 80 MG capsule Take 80 mg by mouth daily.   Yes [provider]  dextroamphetamine (DEXEDRINE SPANSULE) 15 MG 24 hr capsule Take 1 capsule (15 mg total) by mouth daily. 03/05/18  Yes Karamalegos, Devonne Doughty, DO  gabapentin (NEURONTIN) 300 MG capsule Take  1 capsule (300 mg total) by mouth at bedtime. 02/06/18  Yes Draper, Carlos Levering, DO  loratadine (CLARITIN) 10 MG tablet Take 10 mg by mouth daily.   Yes [provider]  MICROGESTIN FE 1.5/30 1.5-30 MG-MCG tablet TK 1 T PO QD 01/06/17  Yes Will Bonnet, MD  sertraline (ZOLOFT) 25 MG tablet Take 25 mg by mouth daily.   Yes [provider]  fluticasone (FLONASE) 50 MCG/ACT nasal spray Place 2 sprays daily into both nostrils. Use for 4-6 weeks then stop and use seasonally or as needed. Patient not taking: Reported on 03/19/2018 08/23/17   Olin Hauser, DO  predniSONE (DELTASONE) 20 MG tablet Take daily with food. Start with 67m (3 pills) x 2 days, then reduce to 457m(2 pills) x 2 days, then 2049m1 pill) x 3 days Patient not taking: Reported on 03/19/2018 03/05/18   KarOlin HauserO    Allergies  Allergen Reactions  . Iodides Rash   Gynecologic History: Patient's last menstrual period was 03/13/2018.  Obstetric History: [id number] History   Socioeconomic History  . Marital status: Married    Spouse name: Not on file  . Number of children: Not on file  . Years of education: Not on file  . Highest education level: Not on file  Occupational History  . Not on file  Social Needs  . Financial resource strain: Not on  file  . Food insecurity:    Worry: Not on file    Inability: Not on file  . Transportation needs:    Medical: Not on file    Non-medical: Not on file  Tobacco Use  . Smoking status: Former Research scientist (life sciences)  . Smokeless tobacco: Never Used  Substance and Sexual Activity  . Alcohol use: Yes    Alcohol/week: 0.6 oz    Types: 1 Glasses of wine per week  . Drug use: No  . Sexual activity: Yes    Birth control/protection: Pill  Lifestyle  . Physical activity:    Days per week: 6 days    Minutes per session: 60 min  . Stress: Not on file  Relationships  . Social connections:    Talks on phone: Not on file    Gets together: Not on  file    Attends religious service: Not on file    Active member of club or organization: Not on file    Attends meetings of clubs or organizations: Not on file    Relationship status: Not on file  . Intimate partner violence:    Fear of current or ex partner: Not on file    Emotionally abused: Not on file    Physically abused: Not on file    Forced sexual activity: Not on file  Other Topics Concern  . Not on file  Social History Narrative  . Not on file    Family History  Problem Relation Age of Onset  . Heart disease Father   . Diabetes Father   . Melanoma Father 73  . Breast cancer Paternal Aunt 31  . Colon cancer Paternal Grandmother 57  . Lung cancer Paternal Grandfather 24  . Breast cancer Cousin 61       Paternal (Dgtr of PA w/Breast)    Review of Systems  Constitutional: Negative.   HENT: Negative.   Eyes: Negative.   Respiratory: Negative.   Cardiovascular: Negative.   Gastrointestinal: Negative.   Genitourinary: Negative.   Musculoskeletal: Positive for back pain.  Skin: Negative.   Neurological: Negative.   Psychiatric/Behavioral: Negative.      Physical Exam BP 110/68 (BP Location: Left Arm, Patient Position: Sitting, Cuff Size: Normal)   Pulse 71   Ht '5\' 4"'  (1.626 m)   Wt 146 lb (66.2 kg)   LMP 03/13/2018   BMI 25.06 kg/m    Physical Exam  Constitutional: She is oriented to person, place, and time. She appears well-developed and well-nourished. No distress.  Genitourinary: Uterus normal. Pelvic exam was performed with patient supine. There is no rash, tenderness, lesion or injury on the right labia. There is no rash, tenderness, lesion or injury on the left labia. No erythema, tenderness or bleeding in the vagina. No signs of injury around the vagina. No vaginal discharge found. Right adnexum does not display mass, does not display tenderness and does not display fullness. Left adnexum does not display mass, does not display tenderness and does not  display fullness. Cervix does not exhibit motion tenderness, lesion, discharge or polyp.   Uterus is mobile and anteverted. Uterus is not enlarged, tender or exhibiting a mass.  HENT:  Head: Normocephalic and atraumatic.  Eyes: EOM are normal. No scleral icterus.  Neck: Normal range of motion. Neck supple. No thyromegaly present.  Cardiovascular: Normal rate and regular rhythm. Exam reveals no gallop and no friction rub.  No murmur heard. Pulmonary/Chest: Effort normal and breath sounds normal. No respiratory distress. She has no  wheezes. She has no rales. Right breast exhibits no inverted nipple, no mass, no nipple discharge, no skin change and no tenderness. Left breast exhibits no inverted nipple, no mass, no nipple discharge, no skin change and no tenderness.  Abdominal: Soft. Bowel sounds are normal. She exhibits no distension and no mass. There is no tenderness. There is no rebound and no guarding.  Musculoskeletal: Normal range of motion. She exhibits no edema or tenderness.  Lymphadenopathy:    She has no cervical adenopathy.       Right: No inguinal adenopathy present.       Left: No inguinal adenopathy present.  Neurological: She is alert and oriented to person, place, and time. No cranial nerve deficit.  Skin: Skin is warm and dry. No rash noted. No erythema.  Psychiatric: She has a normal mood and affect. Her behavior is normal. Judgment normal.    Female chaperone present for pelvic and breast  portions of the physical exam  Results: AUDIT Questionnaire (screen for alcoholism): 4 PHQ-9: 5  Assessment: 32 y.o. [id number] female here for routine annual gynecologic examination.  Plan: Problem List Items Addressed This Visit      Other   Family history of breast cancer    Other Visit Diagnoses    Women's annual routine gynecological examination    -  Primary   Relevant Medications   MICROGESTIN FE 1.5/30 1.5-30 MG-MCG tablet   Screening for depression       Screening for  alcoholism       Encounter for surveillance of contraceptive pills       Relevant Medications   MICROGESTIN FE 1.5/30 1.5-30 MG-MCG tablet      Screening: -- Blood pressure screen normal -- Colonoscopy - not due -- Mammogram - not due -- Weight screening: normal -- Depression screening negative (PHQ-9) -- Nutrition: normal -- cholesterol screening: not due for screening -- osteoporosis screening: not due -- tobacco screening: not using -- alcohol screening: AUDIT questionnaire indicates low-risk usage. -- family history of breast cancer screening: done. not at high risk. -- no evidence of domestic violence or intimate partner violence. -- STD screening: gonorrhea/chlamydia NAAT not collected per patient request. -- pap smear not collected per ASCCP guidelines -- HPV vaccination series: not eligilbe   Discussed updated BRCA testing given family history. Patient will consider.   Prentice Docker, MD 03/19/2018 3:34 PM

## 2018-04-06 ENCOUNTER — Encounter: Payer: Self-pay | Admitting: Obstetrics and Gynecology

## 2018-04-10 ENCOUNTER — Other Ambulatory Visit: Payer: Self-pay | Admitting: *Deleted

## 2018-04-10 MED ORDER — GABAPENTIN 300 MG PO CAPS: 300.0000 mg | ORAL_CAPSULE | Freq: Every day | ORAL | 0 refills | Status: DC

## 2018-06-12 ENCOUNTER — Other Ambulatory Visit: Payer: Self-pay | Admitting: *Deleted

## 2018-06-12 MED ORDER — GABAPENTIN 300 MG PO CAPS: 300.0000 mg | ORAL_CAPSULE | Freq: Every day | ORAL | 0 refills | Status: DC

## 2018-08-28 ENCOUNTER — Ambulatory Visit (INDEPENDENT_AMBULATORY_CARE_PROVIDER_SITE_OTHER): Payer: Managed Care, Other (non HMO) | Admitting: Family Medicine

## 2018-08-28 ENCOUNTER — Encounter: Payer: Self-pay | Admitting: Family Medicine

## 2018-08-28 VITALS — BP 121/75 | HR 72 | Temp 98.4°F | Resp 16 | Ht 64.0 in | Wt 150.0 lb

## 2018-08-28 DIAGNOSIS — J0100 Acute maxillary sinusitis, unspecified: Secondary | ICD-10-CM

## 2018-08-28 MED ORDER — AZITHROMYCIN 250 MG PO TABS: ORAL_TABLET | ORAL | 0 refills | Status: DC

## 2018-08-28 NOTE — Patient Instructions (Addendum)
Thank you for coming to the office today.  It sounds like you have a Sinusitis (Bacterial Infection) - this most likely started as an Upper Respiratory Virus that has settled into an infection. Likely started with a viral infection to begin with, also may have developing bronchitis  - Start Azithromycin antibiotic   May use existing Atrovent decongestant spray or Flonase intranasal steroid spray if interested  Continue Loratadine (Claritin) 10mg  daily  Mucinex DM regularly for 7-10 days  - Recommend to keep using Nasal Saline spray multiple times a day to help flush out congestion and clear sinuses - Improve hydration by drinking plenty of clear fluids (water, gatorade) to reduce secretions and thin congestion - Congestion draining down throat can cause irritation. May try warm herbal tea with honey, cough drops - Can take Tylenol or Ibuprofen as needed for fevers  If you develop persistent fever >101F for at least 3 consecutive days, headaches with sinus pain or pressure or persistent earache, please schedule a follow-up evaluation within next few days to week.  Please schedule a Follow-up Appointment to: Return in about 1 week (around 09/04/2018), or if symptoms worsen or fail to improve, for sinusitis.  If you have any other questions or concerns, please feel free to call the office or send a message through MyChart. You may also schedule an earlier appointment if necessary.  Additionally, you may be receiving a survey about your experience at our office within a few days to 1 week by e-mail or mail. We value your feedback.  Saralyn PilarAlexander Yeng Frankie, DO [facility], [place]

## 2018-08-28 NOTE — Progress Notes (Signed)
Subjective:    Patient ID: Melissa Pierce, female    DOB: 03-05-1986, 32 y.o.   MRN: [id number]  Melissa Pierce is a 32 y.o. female presenting on 08/28/2018 for Sinusitis (as per patient started with Sore throat onset 4 days yestereday felt chest congestion and cough HA sinus pressure)  Patient presents for a same day appointment.  HPI   Acute SINUSITIS, with cough Reports symptoms started 3-4 days ago with sore throat, and then 2 days ago progressed to change with significant sinus pressure bilateral frontal / maxillary, with thicker productive sinus drainage described yellow thicker drainage, and she felt relief in sinuses yesterday and a gradual shift of symptoms down into her chest with chest congestion. Today woke with more "barking cough" sometimes productive and not always. - Tried OTC Mucinex-DM, Neti Pot as well, Tylenol - Recently sick contact, daughter with cough about a week but then resolved. Co-worker 2 cubicles over had some sick symptoms recently - Prior history similar symptoms in 08/2017, initially treated with Amoxicillin did not recover and improved on Z-pak, has some nasal spray left over decongestant Atrovent and Flonse - Admits L ear fullness - Denies any fever, chills, sweats, nausea vomiting, muscle aches  Health Maintenance: She had Flu shot given at work, 07/05/18  Depression screen [facility] 2/9 08/28/2018 03/19/2018 03/05/2018  Decreased Interest 0 0 0  Down, Depressed, Hopeless 0 0 0  PHQ - 2 Score 0 0 0  Altered sleeping - 1 -  Tired, decreased energy - 1 -  Change in appetite - 0 -  Feeling bad or failure about yourself  - 0 -  Trouble concentrating - 1 -  Moving slowly or fidgety/restless - 2 -  Suicidal thoughts - 0 -  PHQ-9 Score - 5 -  Difficult doing work/chores - Somewhat difficult -    Social History   Tobacco Use  . Smoking status: Former Games developer  . Smokeless tobacco: Former Engineer, water Use Topics  . Alcohol use: Yes    Alcohol/week: 1.0  standard drinks    Types: 1 Glasses of wine per week  . Drug use: No    Review of Systems Per HPI unless specifically indicated above     Objective:    BP 121/75   Pulse 72   Temp 98.4 F (36.9 C) (Oral)   Resp 16   Ht 5\' 4"  (1.626 m)   Wt 150 lb (68 kg)   SpO2 100%   BMI 25.75 kg/m   Wt Readings from Last 3 Encounters:  08/28/18 150 lb (68 kg)  03/19/18 146 lb (66.2 kg)  11/10/17 150 lb 12.8 oz (68.4 kg)    Physical Exam  Constitutional: She is oriented to person, place, and time. She appears well-developed and well-nourished. No distress.  Mildly sick and tired appearing, still comfortable, cooperative  HENT:  Head: Normocephalic and atraumatic.  Mouth/Throat: Oropharynx is clear and moist.  Frontal / maxillary sinuses mild tender, L>R. Nares patent without purulence or edema. Bilateral TMs clear without erythema, note L TM has notable bulging and fullness, only minimal clear effusion. Oropharynx clear without erythema, exudates, edema or asymmetry.  Eyes: Conjunctivae are normal. Right eye exhibits no discharge. Left eye exhibits no discharge.  Neck: Normal range of motion. Neck supple.  Cardiovascular: Normal rate, regular rhythm, normal heart sounds and intact distal pulses.  No murmur heard. Pulmonary/Chest: Effort normal and breath sounds normal. No respiratory distress. She has no wheezes. She has no rales.  Good air  movement. Occasional coughing.  Musculoskeletal: Normal range of motion. She exhibits no edema.  Lymphadenopathy:    She has no cervical adenopathy.  Neurological: She is alert and oriented to person, place, and time.  Skin: Skin is warm and dry. No rash noted. She is not diaphoretic. No erythema.  Psychiatric: Her behavior is normal.  Nursing note and vitals reviewed.      Assessment & Plan:   Problem List Items Addressed This Visit    None    Visit Diagnoses    Acute non-recurrent maxillary sinusitis    -  Primary   Relevant Medications     azithromycin (ZITHROMAX Z-PAK) 250 MG tablet      Consistent with acute frontal vs maxillary sinusitis, likely initially viral URI with sick contact Now worsening with concern for second sickening and progression to lower respiratory tract Has some description of possibly purulent sputum and focal sinus pain/pressure  Plan: 1. Start Azithromycin Z pak (antibiotic) 2 tabs day 1, then 1 tab x 4 days, complete entire course even if improved 2. Continue supportive meds - Mucinex DM, may add decongestant OTC, continue Claritin, may use prior Atrovent and Flonase Return criteria reviewed  Recommend out of work today, return in 1-2 days pending improvement. No note requested today.   Meds ordered this encounter  Medications  . azithromycin (ZITHROMAX Z-PAK) 250 MG tablet    Sig: Take 2 tabs (500mg  total) on Day 1. Take 1 tab (250mg ) daily for next 4 days.    Dispense:  6 tablet    Refill:  0    Follow up plan: Return in about 1 week (around 09/04/2018), or if symptoms worsen or fail to improve, for sinusitis.  Saralyn PilarAlexander Essense Bousquet, DO [facility] 08/28/2018, 1:04 PM

## 2019-03-22 ENCOUNTER — Ambulatory Visit (INDEPENDENT_AMBULATORY_CARE_PROVIDER_SITE_OTHER): Payer: Managed Care, Other (non HMO) | Admitting: Obstetrics and Gynecology

## 2019-03-22 ENCOUNTER — Other Ambulatory Visit: Payer: Self-pay

## 2019-03-22 ENCOUNTER — Encounter: Payer: Self-pay | Admitting: Obstetrics and Gynecology

## 2019-03-22 VITALS — BP 122/74 | Ht 64.0 in | Wt 142.0 lb

## 2019-03-22 DIAGNOSIS — Z01419 Encounter for gynecological examination (general) (routine) without abnormal findings: Secondary | ICD-10-CM

## 2019-03-22 DIAGNOSIS — Z1331 Encounter for screening for depression: Secondary | ICD-10-CM

## 2019-03-22 DIAGNOSIS — Z803 Family history of malignant neoplasm of breast: Secondary | ICD-10-CM

## 2019-03-22 DIAGNOSIS — Z1339 Encounter for screening examination for other mental health and behavioral disorders: Secondary | ICD-10-CM

## 2019-03-22 DIAGNOSIS — Z3041 Encounter for surveillance of contraceptive pills: Secondary | ICD-10-CM

## 2019-03-22 MED ORDER — MICROGESTIN FE 1.5/30 1.5-30 MG-MCG PO TABS: 1.0000 | ORAL_TABLET | Freq: Every day | ORAL | 4 refills | Status: DC

## 2019-03-22 NOTE — Progress Notes (Signed)
Gynecology Annual Exam  PCP: Olin Hauser, DO  Chief Complaint  Patient presents with  . Annual Exam    History of Present Illness:  [name] is a 33 y.o. [phone] who LMP was Patient's last menstrual period was 03/11/2019., presents today for her annual examination.  Her menses are regular, lasting 3 day(s).  Dysmenorrhea none. She does not have intermenstrual bleeding.  She is sexually active without issues.  Last Pap: 1 year  Results were: no abnormalities /neg HPV DNA negative Hx of STDs: none  There is a family history of breast cancer in her cousin (paternal, daughter of paternal aunt), paternal aunt. There is no FH of ovarian cancer. The patient does not do self-breast exams.  S/p BRCA testing, which was negative.   Tobacco use: The patient denies current or previous tobacco use. Alcohol use: social drinker Exercise: very active  The patient wears seatbelts: yes.   The patient reports that domestic violence in her life is absent.   Past Medical History:  Diagnosis Date  . ADHD   . Dysmenorrhea   . Family history of breast cancer 09/2014   BRCA neg; IBIS=15%; update testing letter sent 6/19  . Ganglion of foot, left 02/14/2017  . Ganglion of foot, right 02/14/2017  . Tarsal tunnel syndrome    Determined only ganglian cyst    Past Surgical History:  Procedure Laterality Date  . APPENDECTOMY    . APPENDECTOMY  02/22/2012  . CESAREAN SECTION    . CESAREAN SECTION  06/12/2013  . WISDOM TOOTH EXTRACTION      Prior to Admission medications   Medication Sig Start Date End Date Taking? Authorizing Provider  Ascorbic Acid (VITAMIN C) 100 MG tablet Take 100 mg by mouth daily.   Yes [provider]  atomoxetine (STRATTERA) 80 MG capsule Take 80 mg by mouth daily.   Yes [provider]  azithromycin (ZITHROMAX Z-PAK) 250 MG tablet Take 2 tabs (587m total) on Day 1. Take 1 tab (2530m daily for next 4 days. 08/28/18  Yes Karamalegos,  AlDevonne DoughtyDO  cyclobenzaprine (FLEXERIL) 10 MG tablet Take 0.5-1 tablets (5-10 mg total) by mouth 3 (three) times daily as needed for muscle spasms. If too sedating, may only take at bedtime 03/05/18  Yes Karamalegos, AlDevonne DoughtyDO  dextroamphetamine (DEXEDRINE SPANSULE) 15 MG 24 hr capsule Take 1 capsule (15 mg total) by mouth daily. 03/05/18  Yes Karamalegos, AlDevonne DoughtyDO  loratadine (CLARITIN) 10 MG tablet Take 10 mg by mouth daily.   Yes [provider]  MICROGESTIN FE 1.5/30 1.5-30 MG-MCG tablet Take 1 tablet by mouth daily. 03/19/18  Yes JaWill BonnetMD  sertraline (ZOLOFT) 25 MG tablet Take 25 mg by mouth daily.   Yes [provider]    Allergies  Allergen Reactions  . Iodides Rash   Obstetric History: [id number] History   Socioeconomic History  . Marital status: Married    Spouse name: Not on file  . Number of children: Not on file  . Years of education: Not on file  . Highest education level: Not on file  Occupational History  . Not on file  Social Needs  . Financial resource strain: Not on file  . Food insecurity:    Worry: Not on file    Inability: Not on file  . Transportation needs:    Medical: Not on file    Non-medical: Not on file  Tobacco Use  . Smoking status: Former  Smoker  . Smokeless tobacco: Former Network engineer and Sexual Activity  . Alcohol use: Yes    Alcohol/week: 1.0 standard drinks    Types: 1 Glasses of wine per week  . Drug use: No  . Sexual activity: Yes    Birth control/protection: Pill  Lifestyle  . Physical activity:    Days per week: 6 days    Minutes per session: 60 min  . Stress: Not on file  Relationships  . Social connections:    Talks on phone: Not on file    Gets together: Not on file    Attends religious service: Not on file    Active member of club or organization: Not on file    Attends meetings of clubs or organizations: Not on file    Relationship status: Not on file  . Intimate  partner violence:    Fear of current or ex partner: Not on file    Emotionally abused: Not on file    Physically abused: Not on file    Forced sexual activity: Not on file  Other Topics Concern  . Not on file  Social History Narrative  . Not on file    Family History  Problem Relation Age of Onset  . Heart disease Father   . Diabetes Father   . Melanoma Father 46  . Breast cancer Paternal Aunt 89  . Colon cancer Paternal Grandmother 60  . Lung cancer Paternal Grandfather 72  . Breast cancer Cousin 4       Paternal (Dgtr of PA w/Breast)    Review of Systems  Constitutional: Negative.   HENT: Negative.   Eyes: Negative.   Respiratory: Negative.   Cardiovascular: Negative.   Gastrointestinal: Negative.   Genitourinary: Negative.   Musculoskeletal: Negative.   Skin: Negative.   Neurological: Negative.   Psychiatric/Behavioral: Negative.      Physical Exam BP 122/74   Ht 5' 4" (1.626 m)   Wt 142 lb (64.4 kg)   LMP 03/11/2019   BMI 24.37 kg/m    Physical Exam Constitutional:      General: She is not in acute distress.    Appearance: Normal appearance. She is well-developed.  Genitourinary:     Pelvic exam was performed with patient supine.     Vulva, urethra, bladder and uterus normal.     No inguinal adenopathy present in the right or left side.    No signs of injury in the vagina.     No vaginal discharge, erythema, tenderness or bleeding.     No cervical motion tenderness, discharge, lesion or polyp.     Uterus is mobile.     Uterus is not enlarged or tender.     No uterine mass detected.    Uterus is anteverted.     No right or left adnexal mass present.     Right adnexa not tender or full.     Left adnexa not tender or full.  HENT:     Head: Normocephalic and atraumatic.  Eyes:     General: No scleral icterus.    Conjunctiva/sclera: Conjunctivae normal.  Neck:     Musculoskeletal: Normal range of motion and neck supple.     Thyroid: No  thyromegaly.  Cardiovascular:     Rate and Rhythm: Normal rate and regular rhythm.     Heart sounds: No murmur. No friction rub. No gallop.   Pulmonary:     Effort: Pulmonary effort is normal. No respiratory distress.  Breath sounds: Normal breath sounds. No wheezing or rales.  Chest:     Breasts:        Right: No inverted nipple, mass, nipple discharge, skin change or tenderness.        Left: No inverted nipple, mass, nipple discharge, skin change or tenderness.  Abdominal:     General: Bowel sounds are normal. There is no distension.     Palpations: Abdomen is soft. There is no mass.     Tenderness: There is no abdominal tenderness. There is no guarding or rebound.  Musculoskeletal: Normal range of motion.        General: No swelling or tenderness.  Lymphadenopathy:     Cervical: No cervical adenopathy.     Lower Body: No right inguinal adenopathy. No left inguinal adenopathy.  Neurological:     General: No focal deficit present.     Mental Status: She is alert and oriented to person, place, and time.     Cranial Nerves: No cranial nerve deficit.  Skin:    General: Skin is warm and dry.     Findings: No erythema or rash.  Psychiatric:        Mood and Affect: Mood normal.        Behavior: Behavior normal.        Judgment: Judgment normal.     Female chaperone present for pelvic and breast  portions of the physical exam  Results: AUDIT Questionnaire (screen for alcoholism): 2 PHQ-9: 4   Assessment: 33 y.o. [id number] female here for routine annual gynecologic examination  Plan: Problem List Items Addressed This Visit      Other   Family history of breast cancer    Other Visit Diagnoses    Women's annual routine gynecological examination    -  Primary   Relevant Medications   MICROGESTIN FE 1.5/30 1.5-30 MG-MCG tablet   Screening for depression       Screening for alcoholism       Encounter for surveillance of contraceptive pills       Relevant Medications    MICROGESTIN FE 1.5/30 1.5-30 MG-MCG tablet      Screening: -- Blood pressure screen normal -- Weight screening: normal -- Depression screening negative (PHQ-9) -- Nutrition: normal -- cholesterol screening: not due for screening -- osteoporosis screening: not due -- tobacco screening: not using -- alcohol screening: AUDIT questionnaire indicates low-risk usage. -- family history of breast cancer screening: done. not at high risk. -- no evidence of domestic violence or intimate partner violence. -- STD screening: gonorrhea/chlamydia NAAT not collected per patient request. -- pap smear not collected per ASCCP guidelines  Family History of breast cancer: discussed again updated testing.  She will consider. Discussed advantages and disadvantages of testing. We discussed that it makes good sense to re-test at some point with some frequency and we could decide together what frequency makes the most sense to her Will continue to monitor yearly based on the information we have now.   Prentice Docker, MD 03/22/2019 11:52 AM

## 2019-08-23 ENCOUNTER — Telehealth: Payer: Self-pay

## 2019-08-23 NOTE — Telephone Encounter (Signed)
Pt found out 2 days ago that another Paternal Aunt has breast cancer, diagnosed in her 24s. Per her last annual conversation with you, genetic testing or no? Patient aware you are out of the office today.

## 2019-08-26 NOTE — Telephone Encounter (Signed)
It looks like she had negative genetic testing in 2015.  There is the possibility that she could get updated testing since testing for new things have happened since her testing in 2015.  It is probably a good idea for her to consider this testing.  Also, she should have an updated Lovell Sheehan model performed due to this new piece of information. It will likely change the lifetime risk she has of having breast cancer.  If she would like to talk with me about it, i'm happy to give her a call sometime this week (still digging out of all the labs and patient questions from being out last week). Thanks

## 2019-08-28 NOTE — Telephone Encounter (Signed)
Please let me know when pt calls me back  

## 2019-09-20 ENCOUNTER — Telehealth: Payer: Self-pay

## 2019-09-20 ENCOUNTER — Telehealth: Payer: Self-pay | Admitting: Obstetrics and Gynecology

## 2019-09-20 ENCOUNTER — Encounter: Payer: Self-pay | Admitting: Obstetrics and Gynecology

## 2019-09-20 NOTE — Telephone Encounter (Signed)
Patient called with updated information about her family history. She recently had a paternal aunt who was diagnosed with breast cancer. That makes three close female relatives on that side of the family who have had breast cancer (two paternal aunt and a paternal cousin who is the daughter of one of the affected aunts). Her Lovell Sheehan was updated and her calculated lifetime risk is now 22%. She had negative BRCA 1 and 2 testing in 09/2014.  However, she has not had updated testing, for which she qualifies We discussed that, at a minimum, she would need increased surveillance with twice yearly breast exams. Also she would need to start annual mammography and MRI (age to be determined).   She will contact her insurance company to determine her cost for updated genetic testing. She will also schedule an appointment for a clinical breast exam, as 6 months have passed since her last one).   All questions answered for today.

## 2019-09-20 NOTE — Telephone Encounter (Signed)
Patient contacted in a separate encounter.  See that encounter for details.

## 2019-09-20 NOTE — Telephone Encounter (Signed)
Please advise 

## 2019-09-20 NOTE — Telephone Encounter (Signed)
Pt calling triage stating Dr Glennon Mac never gave her a call back about proceeding with the Genetic Testing, she has had a change in her family medical history with her paternal aunt.

## 2019-10-08 ENCOUNTER — Ambulatory Visit: Payer: Managed Care, Other (non HMO) | Attending: Internal Medicine

## 2019-10-08 DIAGNOSIS — Z20822 Contact with and (suspected) exposure to covid-19: Secondary | ICD-10-CM

## 2019-10-10 LAB — NOVEL CORONAVIRUS, NAA: SARS-CoV-2, NAA: NOT DETECTED

## 2019-10-21 ENCOUNTER — Ambulatory Visit: Payer: Managed Care, Other (non HMO) | Attending: Internal Medicine

## 2019-10-21 DIAGNOSIS — Z20822 Contact with and (suspected) exposure to covid-19: Secondary | ICD-10-CM

## 2019-10-22 LAB — NOVEL CORONAVIRUS, NAA: SARS-CoV-2, NAA: NOT DETECTED

## 2019-10-31 ENCOUNTER — Ambulatory Visit: Payer: Managed Care, Other (non HMO) | Attending: Internal Medicine

## 2019-10-31 DIAGNOSIS — Z20822 Contact with and (suspected) exposure to covid-19: Secondary | ICD-10-CM

## 2019-11-01 LAB — NOVEL CORONAVIRUS, NAA: SARS-CoV-2, NAA: NOT DETECTED

## 2019-11-25 ENCOUNTER — Ambulatory Visit
Admission: RE | Admit: 2019-11-25 | Discharge: 2019-11-25 | Disposition: A | Discharge status: Home / Self Care | Payer: Managed Care, Other (non HMO) | Source: Ambulatory Visit | Attending: Family Medicine | Admitting: Family Medicine

## 2019-11-25 ENCOUNTER — Other Ambulatory Visit: Payer: Self-pay

## 2019-11-25 ENCOUNTER — Ambulatory Visit (INDEPENDENT_AMBULATORY_CARE_PROVIDER_SITE_OTHER): Payer: Managed Care, Other (non HMO) | Admitting: Family Medicine

## 2019-11-25 ENCOUNTER — Encounter: Payer: Self-pay | Admitting: Family Medicine

## 2019-11-25 VITALS — BP 131/69 | HR 77 | Temp 98.0°F | Ht 64.0 in | Wt 151.6 lb

## 2019-11-25 DIAGNOSIS — S93401A Sprain of unspecified ligament of right ankle, initial encounter: Secondary | ICD-10-CM

## 2019-11-25 DIAGNOSIS — M25462 Effusion, left knee: Secondary | ICD-10-CM

## 2019-11-25 DIAGNOSIS — M25471 Effusion, right ankle: Secondary | ICD-10-CM

## 2019-11-25 DIAGNOSIS — S8002XA Contusion of left knee, initial encounter: Secondary | ICD-10-CM

## 2019-11-25 DIAGNOSIS — M25562 Pain in left knee: Secondary | ICD-10-CM | POA: Diagnosis present

## 2019-11-25 DIAGNOSIS — M25571 Pain in right ankle and joints of right foot: Secondary | ICD-10-CM | POA: Insufficient documentation

## 2019-11-25 NOTE — Patient Instructions (Addendum)
Thank you for coming to the office today.  Stay tuned for X-ray results, ruling out fractures or other structural injuries.  Likely Left knee contusion, focus on ice and knee sleeve  Use RICE therapy: - R - Rest / relative rest with activity modification avoid overuse of joint - I - Ice packs (make sure you use a towel or sock / something to protect skin) - C - Compression with flexible Knee Sleeve / ACE wrap to apply pressure and reduce swelling allowing more support - E - Elevation - if significant swelling, lift leg above heart level (toes above your nose) to help reduce swelling, most helpful at night after day of being on your feet  Right Ankle, likely inversion sprain, prior injuries probably made it more likely to sprain this time. - Will need Dr Margaretha Sheffield to assist with this in future to help limit recurrence  [facility] Address: [address] [place], [place], [place] [address] Phone: [phone]  Reino Bellis, DO  Please schedule a Follow-up Appointment to: Return in about 4 weeks (around 12/23/2019), or if symptoms worsen or fail to improve, for left knee and right ankle.  If you have any other questions or concerns, please feel free to call the office or send a message through MyChart. You may also schedule an earlier appointment if necessary.  Additionally, you may be receiving a survey about your experience at our office within a few days to 1 week by e-mail or mail. We value your feedback.  Saralyn Pilar, DO [facility], [place]

## 2019-11-25 NOTE — Progress Notes (Signed)
Subjective:    Patient ID: Melissa Pierce, female    DOB: [birth year], 34 y.o.   MRN: [id number]  Melissa Pierce is a 34 y.o. female presenting on 11/25/2019 for Ankle Injury (pt injured her Right ankle running in a race this Saturday.  She rolled her knee why running.) and Knee Injury (The pt injured her left knee running in a race the Saturday. She fell and hit her knee on a flat rock.)   HPI   LEFT KNEE CONTUSION RIGHT ANKLE INVERSION SPRAIN Recently ran a trail race this past weekend in [place] at elevation, she tripped on the terrain and had a direct collision with her left knee against a large rock face that was flat, she developed immediate reaction of pain and nausea without vomiting, she continued the race, then about 5 miles later she described "rolled R ankle" and had a audible pop at that time. Had pain and swelling and finished race - Specifically for the L knee - she has had persistent swelling above knee, used ice packs and also used some heat at times, for superficial laceration used mupirocin on the scrape. She has not had any major instability with regards to standing and walking weight bearing on L side. Worse with going up stairs. - R Ankle, she has had prior injuries to it in the past with ankle sprains back +14 years ago, and most recently within past 2-3 months had more recent ankle sprain was more mild, now she is concerned with the recurrent sprain injury. She uses a ankle brace wrap for support and tapes her ankles prior to running - She changed shoes to Ultras and had issue with rolling both ankles at times. She has been anticipating returning to Dr Micheline Chapman for sports med.    Depression screen [facility] 2/9 11/25/2019 08/28/2018 03/19/2018  Decreased Interest 0 0 0  Down, Depressed, Hopeless 0 0 0  PHQ - 2 Score 0 0 0  Altered sleeping - - 1  Tired, decreased energy - - 1  Change in appetite - - 0  Feeling bad or failure about yourself  - - 0  Trouble concentrating - - 1    Moving slowly or fidgety/restless - - 2  Suicidal thoughts - - 0  PHQ-9 Score - - 5  Difficult doing work/chores - - Somewhat difficult    Social History   Tobacco Use  . Smoking status: Former Research scientist (life sciences)  . Smokeless tobacco: Former Network engineer Use Topics  . Alcohol use: Yes    Alcohol/week: 1.0 standard drinks    Types: 1 Glasses of wine per week  . Drug use: No    Review of Systems Per HPI unless specifically indicated above     Objective:    BP 131/69 (BP Location: Left Arm, Patient Position: Sitting, Cuff Size: Normal)   Pulse 77   Temp 98 F (36.7 C) (Temporal)   Ht 5\' 4"  (1.626 m)   Wt 151 lb 9.6 oz (68.8 kg)   BMI 26.02 kg/m   Wt Readings from Last 3 Encounters:  11/25/19 151 lb 9.6 oz (68.8 kg)  03/22/19 142 lb (64.4 kg)  08/28/18 150 lb (68 kg)    Physical Exam Vitals and nursing note reviewed.  Constitutional:      General: She is not in acute distress.    Appearance: She is well-developed. She is not diaphoretic.     Comments: Well-appearing, comfortable, cooperative  HENT:     Head: Normocephalic and atraumatic.  Eyes:     General:        Right eye: No discharge.        Left eye: No discharge.     Conjunctiva/sclera: Conjunctivae normal.  Cardiovascular:     Rate and Rhythm: Normal rate.  Pulmonary:     Effort: Pulmonary effort is normal.  Musculoskeletal:     Comments: LEFT knee Inspection: mild effusion with superficial scab over knee anteriorly healing. No erythema no ecchymosis Palpation: Moderate tender to palpation lateral joint line and anteriorly and superior ROM: Full active ROM bilaterally Special Testing: Lachman / Valgus/Varus tests negative with intact ligaments (ACL, MCL, LCL) standing thessaly for meniscus testing is intact without instability or pain. Strength: 5/5 intact knee flex/ext, ankle dorsi/plantarflex Neurovascular: distally intact sensation light touch and pulses  Right Ankle Inspection: edema localized over  lateral malleolus, no ecchymosis or erythema Palpation: mild tender inferior to lat malleolus ATF ligament area ROM: full active, pain with ankle inversion Strength: intact 5/5 Neurovascular: intact   Skin:    General: Skin is warm and dry.     Findings: No erythema or rash.  Neurological:     Mental Status: She is alert and oriented to person, place, and time.  Psychiatric:        Behavior: Behavior normal.     Comments: Well groomed, good eye contact, normal speech and thoughts    Results for orders placed or performed in visit on 10/31/19  Novel Coronavirus, NAA ([facility])   Specimen: Nasopharyngeal(NP) swabs in vial transport medium   NASOPHARYNGE  TESTING  Result Value Ref Range   SARS-CoV-2, NAA Not Detected Not Detected      Assessment & Plan:   Problem List Items Addressed This Visit    None    Visit Diagnoses    Contusion of left knee, initial encounter    -  Primary   Relevant Orders   DG Knee Complete 4 Views Left   Pain and swelling of left knee       Relevant Orders   DG Knee Complete 4 Views Left   Inversion sprain of right ankle, initial encounter       Relevant Orders   DG Ankle Complete Right   Pain and swelling of right ankle       Relevant Orders   DG Ankle Complete Right      Acute L knee contusion with some effusion and pain following fall injury 2 days ago, without evidence of ecchymosis and appears structurally intact with ligaments, no instability, able to bear weight. Negative meniscus testing  Plan: 1. X-ray today to rule out fracture - Conservative therapy in meantime, Tylenol, NSAID PRN, continue RICE therapy, ice mostly for swelling, compression sleeve, avoid over activity rest to heal  ---------------------------------- Consistent with mild-mod ankle sprain, suspect inversion sprain given history and localized pain to lateral ATF ligament. Improving edema, no ecchymosis, tolerating wt bearing ambulation. - Concern prior recurrent R  inversion ankle sprains, last 2-3 month ago, and prior >14 years - HIgh intensity running / trail terrain and distance runner - concern for future issues with ankle instability if recurrent sprains  Plan: Check X-ray to rule out fracture or other ankle joint instability  1. Reassurance, will continue to heal over next few weeks 2. Supportive measures with elevation, ice packs for edema, relative rest, keep using ankle brace for compression, resume activity as tolerated  - Return to Dr [facility] when ready to follow-up eval for resuming  higher intensity trail running etc  No orders of the defined types were placed in this encounter.     Follow up plan: Return in about 4 weeks (around 12/23/2019), or if symptoms worsen or fail to improve, for left knee and right ankle.   Saralyn Pilar, DO [facility] 11/25/2019, 2:59 PM

## 2019-12-03 ENCOUNTER — Other Ambulatory Visit: Payer: Self-pay

## 2019-12-03 ENCOUNTER — Ambulatory Visit (INDEPENDENT_AMBULATORY_CARE_PROVIDER_SITE_OTHER): Payer: Managed Care, Other (non HMO) | Admitting: Sports Medicine

## 2019-12-03 VITALS — BP 122/84 | Ht 64.0 in | Wt 145.0 lb

## 2019-12-03 DIAGNOSIS — M25371 Other instability, right ankle: Secondary | ICD-10-CM | POA: Diagnosis not present

## 2019-12-04 ENCOUNTER — Encounter: Payer: Self-pay | Admitting: Sports Medicine

## 2019-12-04 NOTE — Progress Notes (Signed)
   Subjective:    Patient ID: Melissa Pierce, female    DOB: Jan 02, 1986, 34 y.o.   MRN: [id number]  HPI chief complaint: Right ankle pain and left knee pain  Lennyx comes in today with a couple of different complaints.  Main complaint is right ankle pain that began after she suffered an inversion injury about 10 days ago.  She has had ankle sprains in the past.  She localizes most of her pain around the lateral ankle but it is improving.  She has noticed swelling in this area as well.  She is now able to walk without a significant limp but is somewhat concerned about the persistent swelling. She also suffered a left knee injury after falling directly on the left knee.  This injury was also about 10 days ago.  She had some initial swelling which has resolved.  Today she has no knee pain.  No instability.  An x-ray of the left knee was ordered by her PCP and a mention was made by the radiologist about a possible subtle lucency at the lateral margin of the lateral tibial plateau.  Patient was referred to our office to determine whether or not further work-up was necessary.  Intermedic history reviewed Medications reviewed Allergies reviewed    Review of Systems    As above Objective:   Physical Exam  Well-developed, well-nourished.  No acute distress.  Awake alert and oriented x3.  Vital signs reviewed  Right ankle: Patient has full ankle range of motion.  Mild soft tissue swelling laterally.  She is tender to palpation over the area of the ATF.  Positive anterior drawer.  2+ talar tilt.  No tenderness over the medial malleolar nor at the navicular.  Neurovascular intact distally.  Left knee: Full range of motion.  No effusion.  No tenderness to palpation.  Knee is stable to valgus and varus stressing.  Negative anterior drawer, negative posterior drawer.  Neurovascularly intact distally.  Patient ambulates without a limp  X-rays of the right ankle dated November 25, 2019 show some mild soft  tissue swelling laterally but no obvious fracture. X-rays of the left knee also dated November 25, 2019 does show a subtle lucency along the lateral margin of the lateral tibial plateau as well as some mild anterior soft tissue swelling.  No joint effusion is seen.      Assessment & Plan:   Right ankle pain status post right ankle sprain with moderate ankle instability Left knee pain secondary to contusion.  Resolved.  The patient has a history of reoccurring right ankle sprains.  I think she would be best served with formal physical therapy.  She may graduate to a home exercise program per the therapist discretion.  I have also given her a couple of different braces to wear.  She may wear a body helix compression sleeve when running on level surfaces and a more stable med spec brace when on uneven ground.  If she continues to experience instability despite completing physical therapy then she may need to consider surgical reconstruction.  In regards to the left knee: The lateral tibial plateau lucency seen is incidental.  She is completely asymptomatic and her knee is stable to exam.  No restrictions in regard to the left knee.  No further work-up needed.  Follow-up for ongoing or recalcitrant issues.

## 2019-12-24 ENCOUNTER — Encounter: Payer: Self-pay | Admitting: Physical Therapy

## 2019-12-24 ENCOUNTER — Other Ambulatory Visit: Payer: Self-pay

## 2019-12-24 ENCOUNTER — Ambulatory Visit: Payer: Managed Care, Other (non HMO) | Attending: Sports Medicine | Admitting: Physical Therapy

## 2019-12-24 DIAGNOSIS — R29898 Other symptoms and signs involving the musculoskeletal system: Secondary | ICD-10-CM | POA: Diagnosis present

## 2019-12-24 DIAGNOSIS — M25371 Other instability, right ankle: Secondary | ICD-10-CM | POA: Insufficient documentation

## 2019-12-24 NOTE — Patient Instructions (Signed)
Access Code: [email or web address]  URL: [email or web address]  Date: 12/24/2019  Prepared by: Karie Mainland   Exercises  Seated Figure 4 Ankle Inversion with Resistance - 10 reps - 2 sets - 5 hold - 2x daily - 7x weekly  Seated Ankle Eversion with Resistance - 10 reps - 2 sets - 5 hold - 1x daily - 7x weekly  Standing Heel Raise - 10 reps - 2 sets - 30 hold - 1x daily - 7x weekly  Standing Eccentric Heel Raise - 10 reps - 2 sets - 30 hold - 1x daily - 7x weekly  Single Leg Balance with Clock Reach - 10 reps - 2 sets - 30 hold - 1x daily - 7x weekly

## 2019-12-25 NOTE — Therapy (Signed)
[place], [place], [address] Phone: [phone]   Fax:  [phone]  Physical Therapy Evaluation  Patient Details  Name: Melissa Pierce MRN: [id number] Date of Birth: Mar 22, 1986 Referring Provider (PT): Dr. Lilia Argue   Encounter Date: 12/24/2019  PT End of Session - 12/24/19 1442    Visit Number  1    Number of Visits  8    Date for PT Re-Evaluation  02/18/20    PT Start Time  6226    PT Stop Time  1432    PT Time Calculation (min)  47 min    Activity Tolerance  Patient tolerated treatment well    Behavior During Therapy  [facility] for tasks assessed/performed       Past Medical History:  Diagnosis Date  . ADHD   . Dysmenorrhea   . Family history of breast cancer 09/2014   BRCA neg; IBIS=22%; update testing letter sent. Direct discussion on 09/20/2019  . Ganglion of foot, left 02/14/2017  . Ganglion of foot, right 02/14/2017  . Tarsal tunnel syndrome    Determined only ganglian cyst    Past Surgical History:  Procedure Laterality Date  . APPENDECTOMY    . APPENDECTOMY  02/22/2012  . CESAREAN SECTION    . CESAREAN SECTION  06/12/2013  . WISDOM TOOTH EXTRACTION      There were no vitals filed for this visit.   Subjective Assessment - 12/24/19 1349    Subjective  Patient has a history of multple ankle sprains , the last significant one was Dec. 2020 and then 11/23/19 during a trail race.  She figured it was time to get it checked out.  At this time no surgery is indicated.  Is a runner and likes to participate in trail running. She is currently doing road running. She has been wearing her braces Timmothy Sours Joy, body Helix). She also has cyst in her feet and does have pain in her foot when running.    Pertinent History  chronic ankle    Limitations  Other (comment)   running   Diagnostic tests  XR    Patient Stated Goals  Patient would like to be able to continue trail running, up to 6-10 miles.    Currently in Pain?  No/denies         [facility] PT Assessment - 12/24/19 0001      Assessment   Medical Diagnosis  Rt ankle instability     Referring Provider (PT)  Dr. Lilia Argue    Onset Date/Surgical Date  --   Nov 23, 2019   Next MD Visit  unknown     Prior Therapy  No       Precautions   Precautions  None      Restrictions   Weight Bearing Restrictions  No      Balance Screen   Has the patient fallen in the past 6 months  Yes    How many times?  2 sprain during run     Has the patient had a decrease in activity level because of a fear of falling?   Yes   not due to balance    Is the patient reluctant to leave their home because of a fear of falling?   No      Home Film/video editor residence      Prior Function   Level of Independence  Independent    Vocation  Part time  employment    Vocation Requirements  works from home     Leisure  running , kids, reading       Observation/Other Assessments   Focus on Therapeutic Outcomes (FOTO)   16%      Observation/Other Assessments-Edema    Edema  --   puffy area anterior to lateral mallelolus     Circumferential Edema   Circumferential - Right  18 3/8 inch     Circumferential - Left   18 3/8 inch       Sensation   Light Touch  Appears Intact      Functional Tests   Functional tests  Squat;Step up;Step down;Single leg stance      Squat   Comments  WNL       Step Up   Comments  WNL       Step Down   Comments  compensations in hips, fair LE alignment, no pain        Single Leg Stance   Comments  WFL, increased instability with min dynamic tasks       Posture/Postural Control   Posture Comments  normal arches      AROM   Right Ankle Dorsiflexion  0    Right Ankle Inversion  40    Right Ankle Eversion  20    Left Ankle Dorsiflexion  13    Left Ankle Inversion  35    Left Ankle Eversion  20      PROM   Overall PROM Comments  strain end range inversion and eversion Rt ankle  (did not push to end due to instability)       Strength   Right Ankle Dorsiflexion  5/5    Right Ankle Plantar Flexion  4+/5    Right Ankle Inversion  4+/5    Right Ankle Eversion  4+/5      Palpation   Palpation comment  min discomfort Rt lateral ankle talofibular ligament                Objective measurements completed on examination: See above findings.      [facility] Adult PT Treatment/Exercise - 12/24/19 0001      Ankle Exercises: Stretches   Slant Board Stretch  3 reps;30 seconds      Ankle Exercises: Standing   SLS  static and clock     Heel Raises  Both;10 reps    Other Standing Ankle Exercises  single leg x 10       Ankle Exercises: Seated   Other Seated Ankle Exercises  inversion , eversio  blue band x 10              PT Education - 12/24/19 1442    Education Details  PT, instability, proprioception, HEP , POC    Person(s) Educated  Patient    Methods  Explanation    Comprehension  Verbalized understanding;Returned demonstration       PT Short Term Goals - 12/24/19 1447      PT SHORT TERM GOAL #1   Title  Pt will be I with initial HEP for ankle strength, ROM and proprioception    Time  4    Period  Weeks    Status  New    Target Date  01/21/20      PT SHORT TERM GOAL #2   Title  Pt will be able to show equal ankle DF ROM    Baseline  Rt ankle to neutral only  Time  4    Period  Weeks    Status  New    Target Date  01/21/20      PT SHORT TERM GOAL #3   Title  Pt will be able to stand on Rt LE for moderately dynamic activity without increased pain or LOB    Time  4    Period  Weeks    Status  New    Target Date  01/21/20        PT Long Term Goals - 12/25/19 0538      PT LONG TERM GOAL #1   Title  Pt will score <10% limited on FOTO    Baseline  18%    Time  8    Period  Weeks    Status  New    Target Date  02/18/20      PT LONG TERM GOAL #2   Title  Pt will be abto to perform 15 calf raises on Rt ankle with good  alignment and stability.    Baseline  5 reps with min A    Time  8    Period  Weeks    Status  New    Target Date  02/18/20      PT LONG TERM GOAL #3   Title  Pt will be able to perform a trail run/walk  5 miles or less with ankle brace without lasting pain.    Time  8    Period  Weeks    Status  New    Target Date  02/18/20      PT LONG TERM GOAL #4   Title  Pt will be I with HEP end of PT for long term management of chronic issue.    Time  8    Period  Weeks    Status  New    Target Date  02/18/20             Plan - 12/24/19 1443    Clinical Impression Statement  This patient presents for mod complexity eval of chronic Rt ankle instability affecting her participation in fitness and recreation.  She is trail runner and running on the road has always been difficult due to the impact on her feet.  She does not have a great deal of pain today but did show tightness in calves, pain with near end range ankle ROM in eversion and inversion.  She has good strength overall but lacks dynamic stability in her ankles.  She will do very well with corrective exercises and proprioceptive training.  She may always need to wear an ankle brace for light support due to the nature of trail running.    Personal Factors and Comorbidities  Past/Current Experience;Fitness    Examination-Activity Limitations  Other   fitness   Examination-Participation Restrictions  Community Activity;Interpersonal Relationship    Stability/Clinical Decision Making  Evolving/Moderate complexity    Clinical Decision Making  Moderate    Rehab Potential  Excellent    PT Frequency  1x / week    PT Duration  8 weeks    PT Treatment/Interventions  ADLs/Self Care Home Management;Therapeutic activities;Patient/family education;Taping;Therapeutic exercise;Neuromuscular re-education;Manual techniques;Ultrasound;Cryotherapy;Balance training;Vasopneumatic Device;Iontophoresis 90m/ml Dexamethasone    PT Next Visit Plan  check  HEP, give running shoe rec.  , elliptical vs jog with brace? SLS    PT Home Exercise Plan  SLS, ankle EV, INV blue band, calf stretch    Consulted and Agree with Plan of Care  Patient       Patient will benefit from skilled therapeutic intervention in order to improve the following deficits and impairments:  Increased fascial restricitons, Pain, Hypermobility, Decreased strength, Impaired flexibility  Visit Diagnosis: Ankle instability, right  Other symptoms and signs involving the musculoskeletal system     Problem List Patient Active Problem List   Diagnosis Date Noted  . Family history of breast cancer 03/19/2018  . ADHD   . Paresthesia of foot, bilateral 08/09/2016    PAA,JENNIFER 12/25/2019, 5:51 AM  [facility] [address] [place], [place], [address] Phone: [phone]   Fax:  [phone]  Name: Melissa Pierce MRN: [id number] Date of Birth: 03/07/1986   Raeford Razor, PT 12/25/19 5:52 AM Phone: [phone] Fax: [phone]

## 2020-01-08 ENCOUNTER — Other Ambulatory Visit: Payer: Self-pay

## 2020-01-08 ENCOUNTER — Encounter: Payer: Self-pay | Admitting: Physical Therapy

## 2020-01-08 ENCOUNTER — Ambulatory Visit: Payer: Managed Care, Other (non HMO) | Admitting: Physical Therapy

## 2020-01-08 DIAGNOSIS — R29898 Other symptoms and signs involving the musculoskeletal system: Secondary | ICD-10-CM

## 2020-01-08 DIAGNOSIS — M25371 Other instability, right ankle: Secondary | ICD-10-CM

## 2020-01-08 NOTE — Therapy (Signed)
[place], [place], [address] Phone: [phone]   Fax:  [phone]  Physical Therapy Treatment  Patient Details  Name: Melissa Pierce MRN: [id number] Date of Birth: [birth year] Referring Provider (PT): Dr. Lilia Argue   Encounter Date: 01/08/2020  PT End of Session - 01/08/20 1236    Visit Number  2    Number of Visits  8    Date for PT Re-Evaluation  02/18/20    PT Start Time  6962    PT Stop Time  1315    PT Time Calculation (min)  45 min       Past Medical History:  Diagnosis Date  . ADHD   . Dysmenorrhea   . Family history of breast cancer 09/2014   BRCA neg; IBIS=22%; update testing letter sent. Direct discussion on 09/20/2019  . Ganglion of foot, left 02/14/2017  . Ganglion of foot, right 02/14/2017  . Tarsal tunnel syndrome    Determined only ganglian cyst    Past Surgical History:  Procedure Laterality Date  . APPENDECTOMY    . APPENDECTOMY  02/22/2012  . CESAREAN SECTION    . CESAREAN SECTION  06/12/2013  . WISDOM TOOTH EXTRACTION      There were no vitals filed for this visit.  Subjective Assessment - 01/08/20 1237    Subjective  Doing well with the exercises, no pain right now.    Currently in Pain?  No/denies                       [facility] Adult PT Treatment/Exercise - 01/08/20 0001      Manual Therapy   Manual therapy comments  Contract relax to increased Right DF, PROM, Soft tissue work to [employer]- knot at lower medial gastroc softened.       Ankle Exercises: Stretches   Slant Board Stretch  3 reps;30 seconds      Ankle Exercises: Seated   Other Seated Ankle Exercises  inversion , eversio  blue band x 20     Other Seated Ankle Exercises  towel scrunches, plantar self stretch       Ankle Exercises: Standing   SLS  with and without foam     Rebounder  15 reps on and off foam red ball     Heel Raises  15 reps   single 10 right   Other Standing Ankle  Exercises  cone drill to floor x 10, 4inch step down x 10                PT Short Term Goals - 12/24/19 1447      PT SHORT TERM GOAL #1   Title  Pt will be I with initial HEP for ankle strength, ROM and proprioception    Time  4    Period  Weeks    Status  New    Target Date  01/21/20      PT SHORT TERM GOAL #2   Title  Pt will be able to show equal ankle DF ROM    Baseline  Rt ankle to neutral only    Time  4    Period  Weeks    Status  New    Target Date  01/21/20      PT SHORT TERM GOAL #3   Title  Pt will be able to stand on Rt LE for moderately dynamic activity without increased pain or LOB    Time  4    Period  Weeks    Status  New    Target Date  01/21/20        PT Long Term Goals - 12/25/19 0538      PT LONG TERM GOAL #1   Title  Pt will score <10% limited on FOTO    Baseline  18%    Time  8    Period  Weeks    Status  New    Target Date  02/18/20      PT LONG TERM GOAL #2   Title  Pt will be abto to perform 15 calf raises on Rt ankle with good alignment and stability.    Baseline  5 reps with min A    Time  8    Period  Weeks    Status  New    Target Date  02/18/20      PT LONG TERM GOAL #3   Title  Pt will be able to perform a trail run/walk  5 miles or less with ankle brace without lasting pain.    Time  8    Period  Weeks    Status  New    Target Date  02/18/20      PT LONG TERM GOAL #4   Title  Pt will be I with HEP end of PT for long term management of chronic issue.    Time  8    Period  Weeks    Status  New    Target Date  02/18/20            Plan - 01/08/20 1236    Clinical Impression Statement  Pt arrives reporting a small improvement. She is running on even surfaces. Reviewed HEP and progressed with dynamic and unlevel surface SLS activities. Frequent calf stretching throughout session due to tightness. 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 Contract relax by Student PT to increased DF.    PT Next Visit Plan   check HEP, give running shoe rec.  , elliptical vs jog with brace? SLS, ?TPDN    PT Home Exercise Plan  SLS (add pillow for unlevel surface) , ankle EV, INV blue band, calf stretch,       Patient will benefit from skilled therapeutic intervention in order to improve the following deficits and impairments:  Increased fascial restricitons, Pain, Hypermobility, Decreased strength, Impaired flexibility  Visit Diagnosis: Ankle instability, right  Other symptoms and signs involving the musculoskeletal system     Problem List Patient Active Problem List   Diagnosis Date Noted  . Family history of breast cancer 03/19/2018  . ADHD   . Paresthesia of foot, bilateral 08/09/2016    Dorene Ar, PTA 01/08/2020, 2:19 PM  [facility] [address] [place], [place], [address] Phone: [phone]   Fax:  [phone]  Name: Melissa Pierce MRN: [id number] Date of Birth: 10/14/1986

## 2020-01-15 ENCOUNTER — Other Ambulatory Visit: Payer: Self-pay

## 2020-01-15 ENCOUNTER — Encounter: Payer: Self-pay | Admitting: Physical Therapy

## 2020-01-15 ENCOUNTER — Ambulatory Visit: Payer: Managed Care, Other (non HMO) | Admitting: Physical Therapy

## 2020-01-15 DIAGNOSIS — M25371 Other instability, right ankle: Secondary | ICD-10-CM | POA: Diagnosis not present

## 2020-01-15 DIAGNOSIS — R29898 Other symptoms and signs involving the musculoskeletal system: Secondary | ICD-10-CM

## 2020-01-15 NOTE — Therapy (Signed)
Melissa Pierce, [place], [address] Phone: [phone]   Fax:  [phone]  Physical Therapy Treatment  Patient Details  Name: Melissa Pierce MRN: [id number] Date of Birth: Feb 21, 1986 Referring Provider (PT): Dr. Lilia Argue   Encounter Date: 01/15/2020  PT End of Session - 01/15/20 1138    Visit Number  3    Number of Visits  8    Date for PT Re-Evaluation  02/18/20    PT Start Time  1130    PT Stop Time  1222    PT Time Calculation (min)  52 min    Activity Tolerance  Patient tolerated treatment well    Behavior During Therapy  [facility] for tasks assessed/performed       Past Medical History:  Diagnosis Date  . ADHD   . Dysmenorrhea   . Family history of breast cancer 09/2014   BRCA neg; IBIS=22%; update testing letter sent. Direct discussion on 09/20/2019  . Ganglion of foot, left 02/14/2017  . Ganglion of foot, right 02/14/2017  . Tarsal tunnel syndrome    Determined only ganglian cyst    Past Surgical History:  Procedure Laterality Date  . APPENDECTOMY    . APPENDECTOMY  02/22/2012  . CESAREAN SECTION    . CESAREAN SECTION  06/12/2013  . WISDOM TOOTH EXTRACTION      There were no vitals filed for this visit.  Subjective Assessment - 01/15/20 1135    Subjective  Has been running on road no brace as it is typically even terrain.  The lace up brace is so restrictive and she would like to learn a KT tape method.    Currently in Pain?  No/denies        [facility] Adult PT Treatment/Exercise - 01/15/20 0001      Self-Care   Self-Care  Other Self-Care Comments    Other Self-Care Comments   taping technique, You Tube video link       Pilates   Pilates Reformer  footwork 2 Red 1 Blue stretching end of session to gastroc, soleus.  Single leg and double leg stretching. Heel raises full ROM parallel, turnout and toes in x 10 each.  LLE shaky, has less PF power than Lt.       Ankle Exercises: Standing   SLS  with and without foam     Rocker Board  4 minutes   EC, head turns and controlled ankle DF/PF with min to no UE    Balance Master: Static       Heel Raises  15 reps   knee ext and knee flexed for soleus    Warrior III  single leg dead lift variations on foam, grippy with and without 15 lB KB     Other Standing Ankle Exercises  dynamic balance cone taps       Ankle Exercises: Aerobic   Elliptical  L10 ramp L 5 resistance              PT Education - 01/15/20 1342    Education Details  ankle tape , difference in tape, stability    Person(s) Educated  Patient    Methods  Explanation;Other (comment)   email you tube video   Comprehension  Verbalized understanding       PT Short Term Goals - 01/15/20 1342      PT SHORT TERM GOAL #1   Title  Pt will be I with initial HEP for ankle strength, ROM and  proprioception    Status  Achieved      PT SHORT TERM GOAL #2   Title  Pt will be able to show equal ankle DF ROM    Status  Unable to assess      PT SHORT TERM GOAL #3   Title  Pt will be able to stand on Rt LE for moderately dynamic activity without increased pain or LOB    Status  Achieved        PT Long Term Goals - 12/25/19 0538      PT LONG TERM GOAL #1   Title  Pt will score <10% limited on FOTO    Baseline  18%    Time  8    Period  Weeks    Status  New    Target Date  02/18/20      PT LONG TERM GOAL #2   Title  Pt will be abto to perform 15 calf raises on Rt ankle with good alignment and stability.    Baseline  5 reps with min A    Time  8    Period  Weeks    Status  New    Target Date  02/18/20      PT LONG TERM GOAL #3   Title  Pt will be able to perform a trail run/walk  5 miles or less with ankle brace without lasting pain.    Time  8    Period  Weeks    Status  New    Target Date  02/18/20      PT LONG TERM GOAL #4   Title  Pt will be I with HEP end of PT for long term management of chronic issue.    Time  8    Period  Weeks    Status   New    Target Date  02/18/20            Plan - 01/15/20 1138    Clinical Impression Statement  Pt doing well, able to tolerate highly challenging exercises without increased pain.  Rt ankle fatigues quicker than Lt. Noticed L calf tighter than Rt. She plans to use athletic tape to stabilize her ankle in prep for her race 4/12 and see if that can take the place  of her ASO.    PT Treatment/Interventions  ADLs/Self Care Home Management;Therapeutic activities;Patient/family education;Taping;Therapeutic exercise;Neuromuscular re-education;Manual techniques;Ultrasound;Cryotherapy;Balance training;Vasopneumatic Device;Iontophoresis 46m/ml Dexamethasone    PT Next Visit Plan  ankle stability, tape?   elliptical vs jog with brace? SLS, ?TPDN    PT Home Exercise Plan  SLS (add pillow for unlevel surface) , ankle EV, INV blue band, calf stretch    Consulted and Agree with Plan of Care  Patient       Patient will benefit from skilled therapeutic intervention in order to improve the following deficits and impairments:  Increased fascial restricitons, Pain, Hypermobility, Decreased strength, Impaired flexibility  Visit Diagnosis: Ankle instability, right  Other symptoms and signs involving the musculoskeletal system     Problem List Patient Active Problem List   Diagnosis Date Noted  . Family history of breast cancer 03/19/2018  . ADHD   . Paresthesia of foot, bilateral 08/09/2016    Melissa Pierce 01/15/2020, 1:51 PM  [facility] [address]Phone: [phone]  Fax:  [phone] Name: Melissa LeonMRN: [id number] of Birth: [birth year]  JRaeford Razor PT 01/15/20 1:51 PM Phone:  [phone] Fax: [phone]

## 2020-01-22 ENCOUNTER — Encounter: Payer: Self-pay | Admitting: Physical Therapy

## 2020-01-22 ENCOUNTER — Other Ambulatory Visit: Payer: Self-pay

## 2020-01-22 ENCOUNTER — Ambulatory Visit: Payer: Managed Care, Other (non HMO) | Attending: Sports Medicine | Admitting: Physical Therapy

## 2020-01-22 DIAGNOSIS — M25371 Other instability, right ankle: Secondary | ICD-10-CM | POA: Diagnosis present

## 2020-01-22 DIAGNOSIS — R29898 Other symptoms and signs involving the musculoskeletal system: Secondary | ICD-10-CM | POA: Insufficient documentation

## 2020-01-22 NOTE — Therapy (Signed)
[place], [place], [address] Phone: [phone]   Fax:  [phone]  Physical Therapy Treatment  Patient Details  Name: Melissa Pierce MRN: [id number] Date of Birth: 1986/04/19 Referring Provider (PT): Dr. Lilia Argue   Encounter Date: 01/22/2020  PT End of Session - 01/22/20 1003    Visit Number  4    Number of Visits  8    Date for PT Re-Evaluation  02/18/20    PT Start Time  1004    PT Stop Time  1045    PT Time Calculation (min)  41 min    Activity Tolerance  Patient tolerated treatment well    Behavior During Therapy  [facility] for tasks assessed/performed       Past Medical History:  Diagnosis Date  . ADHD   . Dysmenorrhea   . Family history of breast cancer 09/2014   BRCA neg; IBIS=22%; update testing letter sent. Direct discussion on 09/20/2019  . Ganglion of foot, left 02/14/2017  . Ganglion of foot, right 02/14/2017  . Tarsal tunnel syndrome    Determined only ganglian cyst    Past Surgical History:  Procedure Laterality Date  . APPENDECTOMY    . APPENDECTOMY  02/22/2012  . CESAREAN SECTION    . CESAREAN SECTION  06/12/2013  . WISDOM TOOTH EXTRACTION      There were no vitals filed for this visit.  Subjective Assessment - 01/22/20 1007    Subjective  Went hiking this weekend.  Wore Helix and it turned out a bit but it [address].  It feels like it has been worked.    Currently in Pain?  No/denies         [facility] Adult PT Treatment/Exercise - 01/22/20 0001      Manual Therapy   Manual therapy comments  AT tape for Rt ankle       Ankle Exercises: Aerobic   Tread Mill  pretaped ankle, self selected speed 5.5 mph to 6.0    for 5 min      Ankle Exercises: Standing   Rebounder  facing forward, side facing  each side x 30 sec     Other Standing Ankle Exercises  split squat 15 lbs knee to pad, split squat foot on pad x 10 each side , added leg lift (without wgt) for standing SLS balance  challenge on foam     Other Standing Ankle Exercises  freemotion side lunge x 5 plates x 10 each side, retrowalking 5 x and then forward facing diagonal lunge x 10 each leg       Ankle Exercises: Stretches   Slant Board Stretch  3 reps;30 seconds             PT Education - 01/22/20 1200    Education Details  did provide instruction with you tube video for ankle taping.    Person(s) Educated  Patient    Methods  Explanation;Demonstration    Comprehension  Verbalized understanding       PT Short Term Goals - 01/15/20 1342      PT SHORT TERM GOAL #1   Title  Pt will be I with initial HEP for ankle strength, ROM and proprioception    Status  Achieved      PT SHORT TERM GOAL #2   Title  Pt will be able to show equal ankle DF ROM    Status  Unable to assess      PT SHORT TERM GOAL #  3   Title  Pt will be able to stand on Rt LE for moderately dynamic activity without increased pain or LOB    Status  Achieved        PT Long Term Goals - 12/25/19 0538      PT LONG TERM GOAL #1   Title  Pt will score <10% limited on FOTO    Baseline  18%    Time  8    Period  Weeks    Status  New    Target Date  02/18/20      PT LONG TERM GOAL #2   Title  Pt will be abto to perform 15 calf raises on Rt ankle with good alignment and stability.    Baseline  5 reps with min A    Time  8    Period  Weeks    Status  New    Target Date  02/18/20      PT LONG TERM GOAL #3   Title  Pt will be able to perform a trail run/walk  5 miles or less with ankle brace without lasting pain.    Time  8    Period  Weeks    Status  New    Target Date  02/18/20      PT LONG TERM GOAL #4   Title  Pt will be I with HEP end of PT for long term management of chronic issue.    Time  8    Period  Weeks    Status  New    Target Date  02/18/20            Plan - 01/22/20 1201    Clinical Impression Statement  Provided instruction for ankle taping, able to complete session without increased pain  and adequate stability for tasks performed.  Would like to schedule  more for after her race to see if she need to cont PT.    PT Treatment/Interventions  ADLs/Self Care Home Management;Therapeutic activities;Patient/family education;Taping;Therapeutic exercise;Neuromuscular re-education;Manual techniques;Ultrasound;Cryotherapy;Balance training;Vasopneumatic Device;Iontophoresis 66m/ml Dexamethasone    PT Next Visit Plan  ankle stability, tape?   elliptical vs jog with brace? SLS, ?TPDN    PT Home Exercise Plan  SLS (add pillow for unlevel surface) , ankle EV, INV blue band, calf stretch    Consulted and Agree with Plan of Care  Patient       Patient will benefit from skilled therapeutic intervention in order to improve the following deficits and impairments:  Increased fascial restricitons, Pain, Hypermobility, Decreased strength, Impaired flexibility  Visit Diagnosis: Ankle instability, right  Other symptoms and signs involving the musculoskeletal system     Problem List Patient Active Problem List   Diagnosis Date Noted  . Family history of breast cancer 03/19/2018  . ADHD   . Paresthesia of foot, bilateral 08/09/2016    Melissa Pierce 01/22/2020, 12:08 PM  [facility] [address]Phone: [phone]  Fax:  [phone] Name: Melissa WidenerMRN: [id number] of Birth: [birth year] Melissa Pierce PT 01/22/20 12:08 PM Phone: [phone]: [phone]

## 2020-01-29 ENCOUNTER — Ambulatory Visit: Payer: Managed Care, Other (non HMO) | Admitting: Physical Therapy

## 2020-01-29 ENCOUNTER — Other Ambulatory Visit: Payer: Self-pay

## 2020-01-29 ENCOUNTER — Encounter: Payer: Self-pay | Admitting: Physical Therapy

## 2020-01-29 DIAGNOSIS — M25371 Other instability, right ankle: Secondary | ICD-10-CM

## 2020-01-29 DIAGNOSIS — R29898 Other symptoms and signs involving the musculoskeletal system: Secondary | ICD-10-CM

## 2020-01-29 NOTE — Therapy (Addendum)
[place], [place], [address] Phone: [phone]   Fax:  [phone]  Physical Therapy Treatment/Discharge  Patient Details  Name: Melissa Pierce MRN: [id number] Date of Birth: [birth year] Referring Provider (PT): Dr. Lilia Argue   Encounter Date: 01/29/2020  PT End of Session - 01/29/20 1322    Visit Number  5    Number of Visits  8    Date for PT Re-Evaluation  02/18/20    PT Start Time  2099    PT Stop Time  0689    PT Time Calculation (min)  38 min       Past Medical History:  Diagnosis Date  . ADHD   . Dysmenorrhea   . Family history of breast cancer 09/2014   BRCA neg; IBIS=22%; update testing letter sent. Direct discussion on 09/20/2019  . Ganglion of foot, left 02/14/2017  . Ganglion of foot, right 02/14/2017  . Tarsal tunnel syndrome    Determined only ganglian cyst    Past Surgical History:  Procedure Laterality Date  . APPENDECTOMY    . APPENDECTOMY  02/22/2012  . CESAREAN SECTION    . CESAREAN SECTION  06/12/2013  . WISDOM TOOTH EXTRACTION      There were no vitals filed for this visit.  Subjective Assessment - 01/29/20 1318    Subjective  I raced on uneven terrain. I did use the lace up ankle brace because my husband and friends were rolling their ankles just walking around on the uneven terrain prior to the race.    Currently in Pain?  Yes    Pain Score  --   just sore from the run   Pain Location  Ankle    Pain Orientation  Left;Right    Pain Descriptors / Indicators  Sore    Aggravating Factors   running on unlevel surface    Pain Relieving Factors  bracing         [facility] PT Assessment - 01/29/20 0001      Observation/Other Assessments   Focus on Therapeutic Outcomes (FOTO)   5% Limited      AROM   Right Ankle Dorsiflexion  13    Left Ankle Dorsiflexion  13                   [facility] Adult PT Treatment/Exercise - 01/29/20 0001      Ankle Exercises:  Standing   Rebounder  facing forward, side facing  each side x 30 sec     Heel Raises  Right;Left;20 reps   single leg   Other Standing Ankle Exercises  BOSU ankle stabilization: step ups , narrow balance on each side, waide balance and ball toss round side, squats on flat side, weight shifting on flat side      Ankle Exercises: Stretches   Soleus Stretch  --   also edge of step    Slant Board Stretch  3 reps;30 seconds               PT Short Term Goals - 01/29/20 1328      PT SHORT TERM GOAL #1   Title  Pt will be I with initial HEP for ankle strength, ROM and proprioception    Time  4    Period  Weeks    Status  Achieved      PT SHORT TERM GOAL #2   Title  Pt will be able to show equal ankle DF ROM  Baseline  equal bilat    Time  4    Period  Weeks    Status  Achieved      PT SHORT TERM GOAL #3   Title  Pt will be able to stand on Rt LE for moderately dynamic activity without increased pain or LOB    Time  4    Period  Weeks    Status  Achieved        PT Long Term Goals - 01/29/20 1329      PT LONG TERM GOAL #1   Title  Pt will score <10% limited on FOTO    Baseline  5%    Time  8    Period  Weeks    Status  Achieved      PT LONG TERM GOAL #2   Title  Pt will be abto to perform 15 calf raises on Rt ankle with good alignment and stability.    Baseline  20 reps without UE    Time  8    Period  Weeks    Status  Achieved      PT LONG TERM GOAL #3   Title  Pt will be able to perform a trail run/walk  5 miles or less with ankle brace without lasting pain.    Baseline  3.4 miles this weekend with ankle brace and intense terrain, soreness folowing, no pain.    Time  8    Period  Weeks    Status  Achieved      PT LONG TERM GOAL #4   Title  Pt will be I with HEP end of PT for long term management of chronic issue.    Time  8    Period  Weeks    Status  Achieved            Plan - 01/29/20 1356    Clinical Impression Statement  Pt reports  hiking over the weekend with helix brace and felt like her ankle corrected itself better when she stepped on the edge of a rock. She also ran on rough terrain using ankle brace and did well. She is only sore in ankles afterward. She feels like she is ready for discharge to HEP. She plans to purchase a BOSU ball and was instructed in BOSU ball exercises for ankle stability. She has met all LTGS. Will Discharge to HEP today.    PT Next Visit Plan  discharge to HEP today.    PT Home Exercise Plan  SLS (add pillow for unlevel surface) , ankle EV, INV blue band, calf stretch, BOSU ankle stabilization flat and round side       Patient will benefit from skilled therapeutic intervention in order to improve the following deficits and impairments:  Increased fascial restricitons, Pain, Hypermobility, Decreased strength, Impaired flexibility  Visit Diagnosis: Ankle instability, right  Other symptoms and signs involving the musculoskeletal system     Problem List Patient Active Problem List   Diagnosis Date Noted  . Family history of breast cancer 03/19/2018  . ADHD   . Paresthesia of foot, bilateral 08/09/2016    Dorene Ar, PTA 01/29/2020, 2:05 PM  [facility] [address] [place], [place], [address] Phone: [phone]   Fax:  [phone]  Name: Melissa Pierce MRN: [id number] Date of Birth: 11-07-1985  PHYSICAL THERAPY DISCHARGE SUMMARY  Visits from Start of Care: 5  Current functional level related to goals / functional outcomes: See above  Remaining deficits: Ankle instabilty but less so with hiking on uneven terrain   Education / Equipment: HEP, stability, BOSU, ankle taping , brace  Plan: Patient agrees to discharge.  Patient goals were met. Patient is being discharged due to meeting the stated rehab goals.  ?????    Raeford Razor, PT 01/30/20 1:37 PM Phone: [phone] Fax: [phone]

## 2020-02-05 ENCOUNTER — Ambulatory Visit: Payer: Managed Care, Other (non HMO) | Admitting: Physical Therapy

## 2020-04-07 ENCOUNTER — Other Ambulatory Visit: Payer: Self-pay | Admitting: Obstetrics and Gynecology

## 2020-04-07 DIAGNOSIS — Z01419 Encounter for gynecological examination (general) (routine) without abnormal findings: Secondary | ICD-10-CM

## 2020-04-07 DIAGNOSIS — Z3041 Encounter for surveillance of contraceptive pills: Secondary | ICD-10-CM

## 2020-04-07 NOTE — Telephone Encounter (Signed)
Advise

## 2020-04-07 NOTE — Telephone Encounter (Signed)
Patient is scheduled for her annual on 05/04/20 at 9:30 with SDJ

## 2020-04-07 NOTE — Telephone Encounter (Signed)
I'll approve. She should schedule an annual.

## 2020-05-04 ENCOUNTER — Other Ambulatory Visit: Payer: Self-pay

## 2020-05-04 ENCOUNTER — Encounter: Payer: Self-pay | Admitting: Obstetrics and Gynecology

## 2020-05-04 ENCOUNTER — Other Ambulatory Visit (HOSPITAL_COMMUNITY)
Admission: RE | Admit: 2020-05-04 | Discharge: 2020-05-04 | Disposition: A | Discharge status: Home / Self Care | Payer: Managed Care, Other (non HMO) | Source: Ambulatory Visit | Attending: Obstetrics and Gynecology | Admitting: Obstetrics and Gynecology

## 2020-05-04 ENCOUNTER — Ambulatory Visit (INDEPENDENT_AMBULATORY_CARE_PROVIDER_SITE_OTHER): Payer: Managed Care, Other (non HMO) | Admitting: Obstetrics and Gynecology

## 2020-05-04 VITALS — BP 122/74 | Ht 64.0 in | Wt 148.0 lb

## 2020-05-04 DIAGNOSIS — Z1331 Encounter for screening for depression: Secondary | ICD-10-CM

## 2020-05-04 DIAGNOSIS — Z01419 Encounter for gynecological examination (general) (routine) without abnormal findings: Secondary | ICD-10-CM

## 2020-05-04 DIAGNOSIS — Z803 Family history of malignant neoplasm of breast: Secondary | ICD-10-CM

## 2020-05-04 DIAGNOSIS — Z3041 Encounter for surveillance of contraceptive pills: Secondary | ICD-10-CM

## 2020-05-04 DIAGNOSIS — Z124 Encounter for screening for malignant neoplasm of cervix: Secondary | ICD-10-CM | POA: Diagnosis present

## 2020-05-04 DIAGNOSIS — Z1339 Encounter for screening examination for other mental health and behavioral disorders: Secondary | ICD-10-CM

## 2020-05-04 MED ORDER — NORETHIN ACE-ETH ESTRAD-FE 1.5-30 MG-MCG PO TABS: 1.0000 | ORAL_TABLET | Freq: Every day | ORAL | 4 refills | Status: DC

## 2020-05-04 NOTE — Progress Notes (Signed)
Gynecology Annual Exam   PCP: Olin Hauser, DO  Chief Complaint  Patient presents with  . Annual Exam   History of Present Illness:  [name] is a 34 y.o. [phone] who LMP was Patient's last menstrual period was 04/17/2020., presents today for her annual examination.  Her menses are regular every 28-30 days, lasting 5 day(s).  Dysmenorrhea moderate, occurring first 1-2 days of flow. She does not have intermenstrual bleeding.  She is sexually active, no pain with intercourse. .  Last Pap: 12/2016  Results were: no abnormalities /neg HPV DNA negative Hx of STDs: none  There isa family historyof breast cancer in her cousin (paternal, daughter of paternal aunt), paternal aunt. There is no FH of ovarian cancer. The patientdoes notdo self-breast exams. S/p BRCA testing, which was negative. Tyrer-Cuzik score: 22% lifetime risk of breast cancer  Tobacco use: The patient denies current or previous tobacco use. Alcohol use: social drinker Exercise: very active  The patient wears seatbelts: yes.   The patient reports that domestic violence in her life is absent.   Past Medical History:  Diagnosis Date  . ADHD   . Dysmenorrhea   . Family history of breast cancer 09/2014   BRCA neg; IBIS=22%; update testing letter sent. Direct discussion on 09/20/2019  . Ganglion of foot, left 02/14/2017  . Ganglion of foot, right 02/14/2017  . Tarsal tunnel syndrome    Determined only ganglian cyst    Past Surgical History:  Procedure Laterality Date  . APPENDECTOMY    . APPENDECTOMY  02/22/2012  . CESAREAN SECTION    . CESAREAN SECTION  06/12/2013  . WISDOM TOOTH EXTRACTION      Prior to Admission medications   Medication Sig Start Date End Date Taking? Authorizing Provider  Ascorbic Acid (VITAMIN C) 100 MG tablet Take 100 mg by mouth daily.   Yes [provider]  atomoxetine (STRATTERA) 80 MG capsule Take 80 mg by mouth daily.   Yes [provider]    AUROVELA FE 1.5/30 1.5-30 MG-MCG tablet TAKE 1 TABLET BY MOUTH DAILY 04/07/20  Yes Will Bonnet, MD  dextroamphetamine (DEXEDRINE SPANSULE) 15 MG 24 hr capsule Take 1 capsule (15 mg total) by mouth daily. 03/05/18  Yes Karamalegos, Devonne Doughty, DO  loratadine (CLARITIN) 10 MG tablet Take 10 mg by mouth daily.   Yes [provider]  sertraline (ZOLOFT) 25 MG tablet Take 25 mg by mouth daily.   Yes [provider]    Allergies  Allergen Reactions  . Iodides Rash   Obstetric History: [id number]  Social History   Socioeconomic History  . Marital status: Married    Spouse name: Not on file  . Number of children: Not on file  . Years of education: Not on file  . Highest education level: Not on file  Occupational History  . Not on file  Tobacco Use  . Smoking status: Former Research scientist (life sciences)  . Smokeless tobacco: Former Network engineer  . Vaping Use: Never used  Substance and Sexual Activity  . Alcohol use: Yes    Alcohol/week: 1.0 standard drink    Types: 1 Glasses of wine per week  . Drug use: No  . Sexual activity: Yes    Birth control/protection: Pill  Other Topics Concern  . Not on file  Social History Narrative  . Not on file   Social Determinants of Health   Financial Resource Strain:   . Difficulty of Paying Living Expenses:  Food Insecurity:   . Worried About Charity fundraiser in the Last Year:   . Arboriculturist in the Last Year:   Transportation Needs:   . Film/video editor (Medical):   Marland Kitchen Lack of Transportation (Non-Medical):   Physical Activity:   . Days of Exercise per Week:   . Minutes of Exercise per Session:   Stress:   . Feeling of Stress :   Social Connections:   . Frequency of Communication with Friends and Family:   . Frequency of Social Gatherings with Friends and Family:   . Attends Religious Services:   . Active Member of Clubs or Organizations:   . Attends Archivist Meetings:   Marland Kitchen Marital Status:   Intimate  Partner Violence:   . Fear of Current or Ex-Partner:   . Emotionally Abused:   Marland Kitchen Physically Abused:   . Sexually Abused:    Family History  Problem Relation Age of Onset  . Heart disease Father   . Diabetes Father   . Melanoma Father 93  . Breast cancer Paternal Aunt 1       metastatic  . Colon cancer Paternal Grandmother 65  . Lung cancer Paternal Grandfather 61  . Breast cancer Cousin 52       Paternal (Dgtr of [place] w/Breast) metastatic  . Breast cancer Paternal Aunt        69s    Review of Systems  Constitutional: Negative.   HENT: Negative.   Eyes: Negative.   Respiratory: Negative.   Cardiovascular: Negative.   Gastrointestinal: Negative.   Genitourinary: Negative.   Musculoskeletal: Negative.   Skin: Negative.   Neurological: Negative.   Psychiatric/Behavioral: Negative.      Physical Exam BP 122/74   Ht '5\' 4"'  (1.626 m)   Wt 148 lb (67.1 kg)   LMP 04/17/2020   BMI 25.40 kg/m   Physical Exam Constitutional:      General: She is not in acute distress.    Appearance: Normal appearance. She is well-developed.  Genitourinary:     Pelvic exam was performed with patient in the lithotomy position.     Vulva, urethra, bladder and uterus normal.     No inguinal adenopathy present in the right or left side.    No signs of injury in the vagina.     No vaginal discharge, erythema, tenderness or bleeding.     No cervical motion tenderness, discharge, lesion or polyp.     Uterus is mobile.     Uterus is not enlarged or tender.     No uterine mass detected.    Uterus is anteverted.     No right or left adnexal mass present.     Right adnexa not tender or full.     Left adnexa not tender or full.  HENT:     Head: Normocephalic and atraumatic.  Eyes:     General: No scleral icterus.    Conjunctiva/sclera: Conjunctivae normal.  Neck:     Thyroid: No thyromegaly.  Cardiovascular:     Rate and Rhythm: Normal rate and regular rhythm.     Heart sounds: No murmur  heard.  No friction rub. No gallop.   Pulmonary:     Effort: Pulmonary effort is normal. No respiratory distress.     Breath sounds: Normal breath sounds. No wheezing or rales.  Chest:     Breasts:        Right: No inverted nipple, mass, nipple discharge, skin  change or tenderness.        Left: No inverted nipple, mass, nipple discharge, skin change or tenderness.  Abdominal:     General: Bowel sounds are normal. There is no distension.     Palpations: Abdomen is soft. There is no mass.     Tenderness: There is no abdominal tenderness. There is no guarding or rebound.  Musculoskeletal:        General: No swelling or tenderness. Normal range of motion.     Cervical back: Normal range of motion and neck supple.  Lymphadenopathy:     Cervical: No cervical adenopathy.     Lower Body: No right inguinal adenopathy. No left inguinal adenopathy.  Neurological:     General: No focal deficit present.     Mental Status: She is alert and oriented to person, place, and time.     Cranial Nerves: No cranial nerve deficit.  Skin:    General: Skin is warm and dry.     Findings: No erythema or rash.  Psychiatric:        Mood and Affect: Mood normal.        Behavior: Behavior normal.        Judgment: Judgment normal.     Female chaperone present for pelvic and breast  portions of the physical exam  Results: AUDIT Questionnaire (screen for alcoholism): 3 PHQ-9: 0   Assessment: 34 y.o. [id number] female here for routine annual gynecologic examination  Plan: Problem List Items Addressed This Visit      Other   Family history of breast cancer   Relevant Orders   MM 3D SCREEN BREAST BILATERAL    Other Visit Diagnoses    Women's annual routine gynecological examination    -  Primary   Relevant Medications   norethindrone-ethinyl estradiol-iron (AUROVELA FE 1.5/30) 1.5-30 MG-MCG tablet   Other Relevant Orders   Cytology - PAP   MM 3D SCREEN BREAST BILATERAL   Screening for depression         Screening for alcoholism       Encounter for surveillance of contraceptive pills       Relevant Medications   norethindrone-ethinyl estradiol-iron (AUROVELA FE 1.5/30) 1.5-30 MG-MCG tablet   Pap smear for cervical cancer screening       Relevant Orders   Cytology - PAP     Screening: -- Blood pressure screen normal -- Weight screening: normal -- Depression screening negative (PHQ-9) -- Nutrition: normal -- cholesterol screening: not due for screening -- osteoporosis screening: not due -- tobacco screening: not using -- alcohol screening: AUDIT questionnaire indicates low-risk usage. -- family history of breast cancer screening: done. not at high risk. -- no evidence of domestic violence or intimate partner violence. -- STD screening: gonorrhea/chlamydia NAAT not collected per patient request. -- pap smear collected per ASCCP guidelines  Family history of breast cancer with lifetime risk 22%: Ordered mammogram.  Q 61month breast exams.  MRI in 6 months.  Reviewed NCCN recommendations based on her lifetime risk. She also looked into the paperwork for update test, which we can help her fill out.   SPrentice Docker MD 05/04/2020 10:39 AM

## 2020-05-08 ENCOUNTER — Telehealth: Payer: Self-pay | Admitting: Obstetrics and Gynecology

## 2020-05-08 LAB — CYTOLOGY - PAP
Comment: NEGATIVE
Comment: NEGATIVE
Comment: NEGATIVE
Diagnosis: NEGATIVE
Diagnosis: REACTIVE
HPV 16: NEGATIVE
HPV 18 / 45: NEGATIVE
High risk HPV: POSITIVE — AB

## 2020-05-08 NOTE — Telephone Encounter (Signed)
Discussed pap smear results (NILM, HPV+). Recommend repeat pap smear in one year. All questions answered.

## 2020-08-20 ENCOUNTER — Other Ambulatory Visit: Payer: Self-pay | Admitting: Obstetrics and Gynecology

## 2020-08-20 DIAGNOSIS — Z1231 Encounter for screening mammogram for malignant neoplasm of breast: Secondary | ICD-10-CM

## 2020-08-20 DIAGNOSIS — Z803 Family history of malignant neoplasm of breast: Secondary | ICD-10-CM

## 2020-09-24 ENCOUNTER — Other Ambulatory Visit: Payer: Self-pay

## 2020-09-24 ENCOUNTER — Ambulatory Visit
Admission: RE | Admit: 2020-09-24 | Discharge: 2020-09-24 | Disposition: A | Discharge status: Home / Self Care | Payer: Managed Care, Other (non HMO) | Source: Ambulatory Visit | Attending: Obstetrics and Gynecology | Admitting: Obstetrics and Gynecology

## 2020-09-24 DIAGNOSIS — Z1231 Encounter for screening mammogram for malignant neoplasm of breast: Secondary | ICD-10-CM | POA: Diagnosis present

## 2020-09-24 DIAGNOSIS — Z803 Family history of malignant neoplasm of breast: Secondary | ICD-10-CM | POA: Insufficient documentation

## 2020-11-04 ENCOUNTER — Ambulatory Visit: Payer: Self-pay | Admitting: Obstetrics and Gynecology

## 2020-11-06 ENCOUNTER — Encounter: Payer: Self-pay | Admitting: Obstetrics and Gynecology

## 2020-11-06 ENCOUNTER — Ambulatory Visit (INDEPENDENT_AMBULATORY_CARE_PROVIDER_SITE_OTHER): Payer: Managed Care, Other (non HMO) | Admitting: Obstetrics and Gynecology

## 2020-11-06 ENCOUNTER — Other Ambulatory Visit: Payer: Self-pay

## 2020-11-06 VITALS — BP 122/74 | Ht 64.0 in | Wt 157.0 lb

## 2020-11-06 DIAGNOSIS — Z803 Family history of malignant neoplasm of breast: Secondary | ICD-10-CM

## 2020-11-06 NOTE — Progress Notes (Signed)
Obstetrics & Gynecology Office Visit   Chief Complaint  Patient presents with  . Follow-up    6 month breast exam    History of Present Illness: 35 y.o. [phone] female who has a strong family history of breast cancer on her paternal side. She has a Tyrer-Cuzik risk lifetime risk score of 22%. She had a mammogram in December, which was BiRads 1.  She presents today for a breast exam.  She has no symptoms today and no acute complaints.  Her family history has not changed since her last visit.     Past Medical History:  Diagnosis Date  . ADHD   . Dysmenorrhea   . Family history of breast cancer 09/2014   BRCA neg; IBIS=22%; update testing letter sent. Direct discussion on 09/20/2019  . Ganglion of foot, left 02/14/2017  . Ganglion of foot, right 02/14/2017  . Tarsal tunnel syndrome    Determined only ganglian cyst    Past Surgical History:  Procedure Laterality Date  . APPENDECTOMY    . APPENDECTOMY  02/22/2012  . CESAREAN SECTION    . CESAREAN SECTION  06/12/2013  . WISDOM TOOTH EXTRACTION      Gynecologic History: Patient's last menstrual period was 10/30/2020.  Obstetric History: [id number]  Family History  Problem Relation Age of Onset  . Heart disease Father   . Diabetes Father   . Melanoma Father 38  . Breast cancer Paternal Aunt 58       metastatic  . Colon cancer Paternal Grandmother 12  . Lung cancer Paternal Grandfather 17  . Breast cancer Cousin 57       Paternal (Dgtr of [place] w/Breast) metastatic  . Breast cancer Paternal Aunt        45s    Social History   Socioeconomic History  . Marital status: Married    Spouse name: Not on file  . Number of children: Not on file  . Years of education: Not on file  . Highest education level: Not on file  Occupational History  . Not on file  Tobacco Use  . Smoking status: Former Research scientist (life sciences)  . Smokeless tobacco: Former Network engineer  . Vaping Use: Never used  Substance and Sexual Activity  . Alcohol use: Yes     Alcohol/week: 1.0 standard drink    Types: 1 Glasses of wine per week  . Drug use: No  . Sexual activity: Yes    Birth control/protection: Pill  Other Topics Concern  . Not on file  Social History Narrative  . Not on file   Social Determinants of Health   Financial Resource Strain: Not on file  Food Insecurity: Not on file  Transportation Needs: Not on file  Physical Activity: Not on file  Stress: Not on file  Social Connections: Not on file  Intimate Partner Violence: Not on file    Allergies  Allergen Reactions  . Iodides Rash    Prior to Admission medications   Medication Sig Start Date End Date Taking? Authorizing Provider  Ascorbic Acid (VITAMIN C) 100 MG tablet Take 100 mg by mouth daily.    [provider]  atomoxetine (STRATTERA) 80 MG capsule Take 80 mg by mouth daily.    [provider]  dextroamphetamine (DEXEDRINE SPANSULE) 15 MG 24 hr capsule Take 1 capsule (15 mg total) by mouth daily. 03/05/18   Karamalegos, Devonne Doughty, DO  loratadine (CLARITIN) 10 MG tablet Take 10 mg by mouth daily.    [provider]  norethindrone-ethinyl estradiol-iron (AUROVELA FE 1.5/30) 1.5-30 MG-MCG tablet Take 1 tablet by mouth daily. 05/04/20   Will Bonnet, MD  sertraline (ZOLOFT) 25 MG tablet Take 25 mg by mouth daily.    [provider]    Review of Systems  Constitutional: Negative.   HENT: Negative.   Eyes: Negative.   Respiratory: Negative.   Cardiovascular: Negative.   Gastrointestinal: Negative.   Genitourinary: Negative.   Musculoskeletal: Negative.   Skin: Negative.   Neurological: Negative.   Psychiatric/Behavioral: Negative.      Physical Exam BP 122/74   Ht 5' 4" (1.626 m)   Wt 157 lb (71.2 kg)   LMP 10/30/2020   BMI 26.95 kg/m  Patient's last menstrual period was 10/30/2020. Physical Exam Constitutional:      General: She is not in acute distress.    Appearance: Normal appearance.  Genitourinary:   Breasts:     Tanner Score is 5.     Right: No swelling, bleeding, inverted nipple, mass, nipple discharge, skin change, tenderness, axillary adenopathy or supraclavicular adenopathy.     Left: No swelling, bleeding, inverted nipple, mass, nipple discharge, skin change, tenderness, axillary adenopathy or supraclavicular adenopathy.    HENT:     Head: Normocephalic and atraumatic.  Eyes:     General: No scleral icterus.    Conjunctiva/sclera: Conjunctivae normal.  Lymphadenopathy:     Upper Body:     Right upper body: No supraclavicular or axillary adenopathy.     Left upper body: No supraclavicular or axillary adenopathy.  Neurological:     General: No focal deficit present.     Mental Status: She is alert and oriented to person, place, and time.     Cranial Nerves: No cranial nerve deficit.  Psychiatric:        Mood and Affect: Mood normal.        Behavior: Behavior normal.        Judgment: Judgment normal.     Female chaperone present for pelvic and breast  portions of the physical exam  Assessment: 35 y.o. A2V6720 female here for  1. Family history of breast cancer      Plan: Problem List Items Addressed This Visit      Other   Family history of breast cancer - Primary     Plan for MRI in June 2022 and return for annual with clinical breast exam in 6 months.   Prentice Docker, MD 11/06/2020 8:26 AM

## 2021-01-14 ENCOUNTER — Telehealth (INDEPENDENT_AMBULATORY_CARE_PROVIDER_SITE_OTHER): Payer: Managed Care, Other (non HMO) | Admitting: Physician Assistant

## 2021-01-14 ENCOUNTER — Other Ambulatory Visit: Payer: Self-pay

## 2021-01-14 ENCOUNTER — Encounter: Payer: Self-pay | Admitting: Physician Assistant

## 2021-01-14 DIAGNOSIS — J069 Acute upper respiratory infection, unspecified: Secondary | ICD-10-CM

## 2021-01-14 DIAGNOSIS — J40 Bronchitis, not specified as acute or chronic: Secondary | ICD-10-CM

## 2021-01-14 DIAGNOSIS — J040 Acute laryngitis: Secondary | ICD-10-CM

## 2021-01-14 MED ORDER — PREDNISONE 10 MG PO TABS: 10.0000 mg | ORAL_TABLET | Freq: Every day | ORAL | 0 refills | Status: DC

## 2021-01-14 MED ORDER — ALBUTEROL SULFATE HFA 108 (90 BASE) MCG/ACT IN AERS: 2.0000 | INHALATION_SPRAY | Freq: Four times a day (QID) | RESPIRATORY_TRACT | 2 refills | Status: DC | PRN

## 2021-01-14 MED ORDER — PROMETHAZINE-DM 6.25-15 MG/5ML PO SYRP: 5.0000 mL | ORAL_SOLUTION | Freq: Four times a day (QID) | ORAL | 0 refills | Status: DC | PRN

## 2021-01-14 NOTE — Patient Instructions (Signed)

## 2021-01-14 NOTE — Progress Notes (Addendum)
Established patient visit  Virtual Visit via Telephone Note  I connected with [name] on 01/15/21 at  9:40 AM EDT by telephone application and verified that I am speaking with the correct person using two identifiers.  Location: Patient: Home Provider: Office    I discussed the limitations of evaluation and management by telemedicine and the availability of in person appointments. The patient expressed understanding and agreed to proceed.   Patient: Melissa Pierce   DOB: 01-06-1986   35 y.o. Female  MRN: [id number] Visit Date: 01/14/2021  Today's healthcare provider: Trey Sailors, PA-C   Chief Complaint  Patient presents with  . Cough    Runny noses, sneezing, nasal congestions after running a race on Saturday. Dry cough, hoarseness, chest congestions. Pt was started on Amoxicillin x 2 days , pt diagnose with URI infection. She had a visit with [facility] visit.    Subjective    HPI HPI    Cough    Comments: Runny noses, sneezing, nasal congestions after running a race on Saturday. Dry cough, hoarseness, chest congestions. Pt was started on Amoxicillin x 2 days , pt diagnose with URI infection. She had a visit with [facility] visit.        Last edited by Lonna Cobb, CMA on 01/14/2021  9:40 AM. (History)      Denies fevers, SOB. She reports a hoarse voice. Reports amoxicillin has not been helpful.      Medications: Outpatient Medications Prior to Visit  Medication Sig  . amoxicillin (AMOXIL) 875 MG tablet Take 875 mg by mouth 2 (two) times daily.  Marland Kitchen atomoxetine (STRATTERA) 80 MG capsule Take 80 mg by mouth daily.  Marland Kitchen dextroamphetamine (DEXEDRINE SPANSULE) 15 MG 24 hr capsule Take 1 capsule (15 mg total) by mouth daily.  Marland Kitchen loratadine (CLARITIN) 10 MG tablet Take 10 mg by mouth daily.  . norethindrone-ethinyl estradiol-iron (AUROVELA FE 1.5/30) 1.5-30 MG-MCG tablet Take 1 tablet by mouth daily.  . phenylephrine (SUDAFED PE) 10 MG TABS  tablet Take 10 mg by mouth every 4 (four) hours as needed.  . sertraline (ZOLOFT) 25 MG tablet Take 25 mg by mouth daily.  . Ascorbic Acid (VITAMIN C) 100 MG tablet Take 100 mg by mouth daily. (Patient not taking: Reported on 01/14/2021)   No facility-administered medications prior to visit.    Review of Systems  All other systems reviewed and are negative.      Objective    There were no vitals taken for this visit.    Physical Exam    No results found for any visits on 01/14/21.  Assessment & Plan    1. Upper respiratory tract infection, unspecified type  - promethazine-dextromethorphan (PROMETHAZINE-DM) 6.25-15 MG/5ML syrup; Take 5 mLs by mouth 4 (four) times daily as needed for cough.  Dispense: 118 mL; Refill: 0 - albuterol (VENTOLIN HFA) 108 (90 Base) MCG/ACT inhaler; Inhale 2 puffs into the lungs every 6 (six) hours as needed for wheezing or shortness of breath.  Dispense: 1 each; Refill: 2  2. Laryngitis  - predniSONE (DELTASONE) 10 MG tablet; Take 1 tablet (10 mg total) by mouth daily with breakfast.  Dispense: 21 tablet; Refill: 0  3. Bronchitis  - predniSONE (DELTASONE) 10 MG tablet; Take 1 tablet (10 mg total) by mouth daily with breakfast.  Dispense: 21 tablet; Refill: 0 - promethazine-dextromethorphan (PROMETHAZINE-DM) 6.25-15 MG/5ML syrup; Take 5 mLs by mouth 4 (four) times daily as needed for cough.  Dispense: 118 mL; Refill: 0 - albuterol (VENTOLIN HFA) 108 (90 Base) MCG/ACT inhaler; Inhale 2 puffs into the lungs every 6 (six) hours as needed for wheezing or shortness of breath.  Dispense: 1 each; Refill: 2     Return if symptoms worsen or fail to improve.       I spent 11 minutes dedicated to the care of this patient on the date of this encounter to include pre-visit review of records, non face-to-face time with the patient discussing bronchitis, and post visit ordering of testing.  Trey Sailors, PA-C  [facility] [phone]  (phone) [phone] (fax)  [facility]

## 2021-03-24 ENCOUNTER — Telehealth: Payer: Self-pay

## 2021-03-24 NOTE — Telephone Encounter (Signed)
Patient is calling today to get an order for a breast MRI. Patient is aware SDJ is out of the office but returns tomorrow. Please advise

## 2021-03-29 ENCOUNTER — Telehealth: Payer: Self-pay | Admitting: Obstetrics and Gynecology

## 2021-03-29 NOTE — Telephone Encounter (Signed)
Left message for patient to reschedule her 7/25 appt. With SDJ.

## 2021-03-30 ENCOUNTER — Telehealth: Payer: Self-pay | Admitting: Obstetrics and Gynecology

## 2021-03-30 NOTE — Telephone Encounter (Signed)
Called pt and left message about rescheduling her 7/25 appt with SD[facility].   

## 2021-03-31 ENCOUNTER — Other Ambulatory Visit: Payer: Self-pay | Admitting: Obstetrics and Gynecology

## 2021-03-31 DIAGNOSIS — Z803 Family history of malignant neoplasm of breast: Secondary | ICD-10-CM

## 2021-03-31 NOTE — Telephone Encounter (Signed)
Patient is aware to contact [place] for scheduling

## 2021-04-20 ENCOUNTER — Other Ambulatory Visit: Payer: Self-pay

## 2021-04-20 ENCOUNTER — Ambulatory Visit
Admission: RE | Admit: 2021-04-20 | Discharge: 2021-04-20 | Disposition: A | Discharge status: Home / Self Care | Payer: Managed Care, Other (non HMO) | Source: Ambulatory Visit | Attending: Obstetrics and Gynecology | Admitting: Obstetrics and Gynecology

## 2021-04-20 DIAGNOSIS — Z1501 Genetic susceptibility to malignant neoplasm of breast: Secondary | ICD-10-CM | POA: Diagnosis not present

## 2021-04-20 DIAGNOSIS — Z803 Family history of malignant neoplasm of breast: Secondary | ICD-10-CM | POA: Diagnosis present

## 2021-04-20 MED ORDER — GADOBUTROL 1 MMOL/ML IV SOLN
7.0000 mL | Freq: Once | INTRAVENOUS | Status: AC | PRN
  Administered 2021-04-20: 09:00:00 7 mL via INTRAVENOUS

## 2021-05-10 ENCOUNTER — Ambulatory Visit: Payer: Managed Care, Other (non HMO) | Admitting: Obstetrics and Gynecology

## 2021-05-17 ENCOUNTER — Other Ambulatory Visit: Payer: Self-pay | Admitting: Obstetrics and Gynecology

## 2021-05-17 DIAGNOSIS — Z01419 Encounter for gynecological examination (general) (routine) without abnormal findings: Secondary | ICD-10-CM

## 2021-05-17 DIAGNOSIS — Z3041 Encounter for surveillance of contraceptive pills: Secondary | ICD-10-CM

## 2021-05-20 ENCOUNTER — Other Ambulatory Visit: Payer: Self-pay

## 2021-05-20 ENCOUNTER — Ambulatory Visit (INDEPENDENT_AMBULATORY_CARE_PROVIDER_SITE_OTHER): Payer: Managed Care, Other (non HMO) | Admitting: Obstetrics and Gynecology

## 2021-05-20 ENCOUNTER — Encounter: Payer: Self-pay | Admitting: Obstetrics and Gynecology

## 2021-05-20 ENCOUNTER — Other Ambulatory Visit (HOSPITAL_COMMUNITY)
Admission: RE | Admit: 2021-05-20 | Discharge: 2021-05-20 | Disposition: A | Discharge status: Home / Self Care | Payer: Managed Care, Other (non HMO) | Source: Ambulatory Visit | Attending: Obstetrics and Gynecology | Admitting: Obstetrics and Gynecology

## 2021-05-20 VITALS — BP 98/56 | HR 82 | Ht 64.0 in | Wt 151.0 lb

## 2021-05-20 DIAGNOSIS — Z1331 Encounter for screening for depression: Secondary | ICD-10-CM

## 2021-05-20 DIAGNOSIS — Z01419 Encounter for gynecological examination (general) (routine) without abnormal findings: Secondary | ICD-10-CM | POA: Insufficient documentation

## 2021-05-20 DIAGNOSIS — R87618 Other abnormal cytological findings on specimens from cervix uteri: Secondary | ICD-10-CM

## 2021-05-20 DIAGNOSIS — Z23 Encounter for immunization: Secondary | ICD-10-CM | POA: Diagnosis not present

## 2021-05-20 DIAGNOSIS — Z1339 Encounter for screening examination for other mental health and behavioral disorders: Secondary | ICD-10-CM

## 2021-05-20 DIAGNOSIS — Z803 Family history of malignant neoplasm of breast: Secondary | ICD-10-CM

## 2021-05-20 NOTE — Progress Notes (Signed)
Gynecology Annual Exam   PCP: Olin Hauser, DO  Chief Complaint  Patient presents with   Annual Exam   History of Present Illness:  [name] is a 35 y.o. [id number] who LMP was Patient's last menstrual period was 05/07/2021 (exact date)., presents today for her annual examination.  Her menses are regular every 28-30 days, lasting 5 day(s).  Dysmenorrhea moderate, occurring first 1-2 days of flow. She does not have intermenstrual bleeding.  She is sexually active, no pain with intercourse.   Last Pap: 04/2020  Results were: no abnormalities /neg HPV DNA positive (HPV 16/18 negative). Hx of STDs: none  There is a family history of breast cancer in her cousin (paternal, daughter of paternal aunt), paternal aunt. There is no FH of ovarian cancer. The patient does not do self-breast exams.  S/p BRCA testing, which was negative.  Tyrer-Cuzik score: 22% lifetime risk of breast cancer  Tobacco use: The patient denies current or previous tobacco use. Alcohol use: social drinker Exercise: very active  The patient wears seatbelts: yes.   The patient reports that domestic violence in her life is absent.   Past Medical History:  Diagnosis Date   ADHD    Dysmenorrhea    Family history of breast cancer 09/2014   BRCA neg; IBIS=22%; update testing letter sent. Direct discussion on 09/20/2019   Ganglion of foot, left 02/14/2017   Ganglion of foot, right 02/14/2017   Tarsal tunnel syndrome    Determined only ganglian cyst    Past Surgical History:  Procedure Laterality Date   APPENDECTOMY     APPENDECTOMY  02/22/2012   CESAREAN SECTION     CESAREAN SECTION  06/12/2013   WISDOM TOOTH EXTRACTION      Prior to Admission medications   Medication Sig Start Date End Date Taking? Authorizing Provider  Ascorbic Acid (VITAMIN C) 100 MG tablet Take 100 mg by mouth daily.   Yes [provider]  atomoxetine (STRATTERA) 80 MG capsule Take 80 mg by mouth daily.    Yes [provider]  AUROVELA FE 1.5/30 1.5-30 MG-MCG tablet TAKE 1 TABLET BY MOUTH DAILY 04/07/20  Yes Will Bonnet, MD  dextroamphetamine (DEXEDRINE SPANSULE) 15 MG 24 hr capsule Take 1 capsule (15 mg total) by mouth daily. 03/05/18  Yes Karamalegos, Devonne Doughty, DO  loratadine (CLARITIN) 10 MG tablet Take 10 mg by mouth daily.   Yes [provider]  sertraline (ZOLOFT) 25 MG tablet Take 25 mg by mouth daily.   Yes [provider]    Allergies  Allergen Reactions   Iodides Rash   Obstetric History: [id number]  Social History   Socioeconomic History   Marital status: Married    Spouse name: Not on file   Number of children: Not on file   Years of education: Not on file   Highest education level: Not on file  Occupational History   Not on file  Tobacco Use   Smoking status: Former   Smokeless tobacco: Never  Vaping Use   Vaping Use: Never used  Substance and Sexual Activity   Alcohol use: Yes    Alcohol/week: 1.0 standard drink    Types: 1 Glasses of wine per week    Comment: occasionally   Drug use: No   Sexual activity: Yes    Birth control/protection: Pill  Other Topics Concern   Not on file  Social History Narrative   Not on file   Social Determinants of Health  Financial Resource Strain: Not on file  Food Insecurity: Not on file  Transportation Needs: Not on file  Physical Activity: Not on file  Stress: Not on file  Social Connections: Not on file  Intimate Partner Violence: Not on file   Family History  Problem Relation Age of Onset   Heart disease Father    Diabetes Father    Melanoma Father 37   Breast cancer Paternal Aunt 18       metastatic   Colon cancer Paternal Grandmother 94   Lung cancer Paternal Grandfather 92   Breast cancer Cousin 51       Paternal (Dgtr of PA w/Breast) metastatic   Breast cancer Paternal Aunt        8s    Review of Systems  Constitutional: Negative.   HENT: Negative.    Eyes:  Negative.   Respiratory: Negative.    Cardiovascular: Negative.   Gastrointestinal: Negative.   Genitourinary: Negative.   Musculoskeletal: Negative.   Skin: Negative.   Neurological: Negative.   Psychiatric/Behavioral: Negative.      Physical Exam BP (!) 98/56 (Cuff Size: Normal)   Pulse 82   Ht _0  (1.626 m)   Wt 151 lb (68.5 kg)   LMP 05/07/2021 (Exact Date)   BMI 25.92 kg/m   Physical Exam Constitutional:      General: She is not in acute distress.    Appearance: Normal appearance. She is well-developed.  Genitourinary:     Vulva and bladder normal.     No vaginal discharge, erythema, tenderness or bleeding.      Right Adnexa: not tender, not full and no mass present.    Left Adnexa: not tender, not full and no mass present.    No cervical motion tenderness, discharge, lesion or polyp.     Uterus is not enlarged or tender.     No uterine mass detected.    Pelvic exam was performed with patient in the lithotomy position.  Breasts:    Right: No inverted nipple, mass, nipple discharge, skin change or tenderness.     Left: No inverted nipple, mass, nipple discharge, skin change or tenderness.  HENT:     Head: Normocephalic and atraumatic.  Eyes:     General: No scleral icterus.    Conjunctiva/sclera: Conjunctivae normal.  Neck:     Thyroid: No thyromegaly.  Cardiovascular:     Rate and Rhythm: Normal rate and regular rhythm.     Heart sounds: No murmur heard.   No friction rub. No gallop.  Pulmonary:     Effort: Pulmonary effort is normal. No respiratory distress.     Breath sounds: Normal breath sounds. No wheezing or rales.  Abdominal:     General: Bowel sounds are normal. There is no distension.     Palpations: Abdomen is soft. There is no mass.     Tenderness: There is no abdominal tenderness. There is no guarding or rebound.  Musculoskeletal:        General: No swelling or tenderness. Normal range of motion.     Cervical back: Normal range of motion and  neck supple.  Lymphadenopathy:     Cervical: No cervical adenopathy.     Lower Body: No right inguinal adenopathy. No left inguinal adenopathy.  Neurological:     General: No focal deficit present.     Mental Status: She is alert and oriented to person, place, and time.     Cranial Nerves: No cranial nerve deficit.  Skin:  General: Skin is warm and dry.     Findings: No erythema or rash.  Psychiatric:        Mood and Affect: Mood normal.        Behavior: Behavior normal.        Judgment: Judgment normal.   Female chaperone present for pelvic and breast  portions of the physical exam  Results: AUDIT Questionnaire (screen for alcoholism): 2 PHQ-9: 2.5   Assessment: 35 y.o. [id number] female here for routine annual gynecologic examination  Plan: Problem List Items Addressed This Visit       Other   Family history of breast cancer   Relevant Orders   MM 3D SCREEN BREAST BILATERAL   Other Visit Diagnoses     Women's annual routine gynecological examination    -  Primary   Relevant Orders   Cytology - PAP   MM 3D SCREEN BREAST BILATERAL   Screening for depression       Screening for alcoholism       Pap smear abnormality of cervix/human papillomavirus (HPV) positive       Relevant Orders   Cytology - PAP     Screening: -- Blood pressure screen normal -- Weight screening: normal -- Depression screening negative (PHQ-9) -- Nutrition: normal -- cholesterol screening: not due for screening -- osteoporosis screening: not due -- tobacco screening: not using -- alcohol screening: AUDIT questionnaire indicates low-risk usage. -- family history of breast cancer screening: done. not at high risk. -- no evidence of domestic violence or intimate partner violence. -- STD screening: gonorrhea/chlamydia NAAT not collected per patient request. -- pap smear collected per ASCCP guidelines -- Gardasil 9-valent #1 today, followed by #2 in 2 months, and #3 in 6 months   Family  history of breast cancer with lifetime risk 22%: Ordered mammogram.  Q 6 months breast exams.  MRI in 6 months.  Reviewed NCCN recommendations based on her lifetime risk. She also looked into the paperwork for update test, which we can help her fill out.   Prentice Docker, MD 05/20/2021 9:23 AM

## 2021-05-25 LAB — CYTOLOGY - PAP
Comment: NEGATIVE
Diagnosis: NEGATIVE
High risk HPV: NEGATIVE

## 2021-06-29 ENCOUNTER — Encounter: Payer: Self-pay | Admitting: Obstetrics and Gynecology

## 2021-07-19 ENCOUNTER — Ambulatory Visit (INDEPENDENT_AMBULATORY_CARE_PROVIDER_SITE_OTHER): Payer: Managed Care, Other (non HMO)

## 2021-07-19 ENCOUNTER — Other Ambulatory Visit: Payer: Self-pay

## 2021-07-19 DIAGNOSIS — Z23 Encounter for immunization: Secondary | ICD-10-CM | POA: Diagnosis not present

## 2021-07-19 NOTE — Progress Notes (Signed)
Pt here for 2nd Gardasil which was given IM right deltoid.  Pt tolerated well.  NDC# [phone]

## 2021-09-17 ENCOUNTER — Other Ambulatory Visit: Payer: Self-pay

## 2021-09-17 ENCOUNTER — Ambulatory Visit (INDEPENDENT_AMBULATORY_CARE_PROVIDER_SITE_OTHER): Payer: Managed Care, Other (non HMO)

## 2021-09-17 DIAGNOSIS — Z23 Encounter for immunization: Secondary | ICD-10-CM

## 2021-10-22 ENCOUNTER — Ambulatory Visit
Admission: RE | Admit: 2021-10-22 | Discharge: 2021-10-22 | Disposition: A | Discharge status: Home / Self Care | Payer: Managed Care, Other (non HMO) | Source: Ambulatory Visit | Attending: Obstetrics and Gynecology | Admitting: Obstetrics and Gynecology

## 2021-10-22 ENCOUNTER — Other Ambulatory Visit: Payer: Self-pay

## 2021-10-22 DIAGNOSIS — Z803 Family history of malignant neoplasm of breast: Secondary | ICD-10-CM | POA: Insufficient documentation

## 2021-10-22 DIAGNOSIS — Z1231 Encounter for screening mammogram for malignant neoplasm of breast: Secondary | ICD-10-CM | POA: Insufficient documentation

## 2021-10-22 DIAGNOSIS — Z01419 Encounter for gynecological examination (general) (routine) without abnormal findings: Secondary | ICD-10-CM

## 2021-11-01 ENCOUNTER — Other Ambulatory Visit: Payer: Self-pay | Admitting: Obstetrics and Gynecology

## 2021-11-01 DIAGNOSIS — Z803 Family history of malignant neoplasm of breast: Secondary | ICD-10-CM

## 2021-11-01 DIAGNOSIS — Z1239 Encounter for other screening for malignant neoplasm of breast: Secondary | ICD-10-CM

## 2021-11-09 ENCOUNTER — Other Ambulatory Visit: Payer: Self-pay | Admitting: Obstetrics and Gynecology

## 2021-11-09 DIAGNOSIS — Z1239 Encounter for other screening for malignant neoplasm of breast: Secondary | ICD-10-CM

## 2021-11-09 DIAGNOSIS — Z803 Family history of malignant neoplasm of breast: Secondary | ICD-10-CM

## 2022-03-31 IMAGING — MG MM DIGITAL SCREENING BILAT W/ TOMO AND CAD
8 series · 9 of 24 positions shown · non-contrast
Comparison: Previous exam(s).

CLINICAL DATA: Screening.

EXAM:
DIGITAL SCREENING BILATERAL MAMMOGRAM WITH TOMOSYNTHESIS AND CAD
TECHNIQUE: Bilateral screening digital craniocaudal and mediolateral oblique
mammograms were obtained. Bilateral screening digital breast
tomosynthesis was performed. The images were evaluated with
computer-aided detection.

[L CC synth-2D]
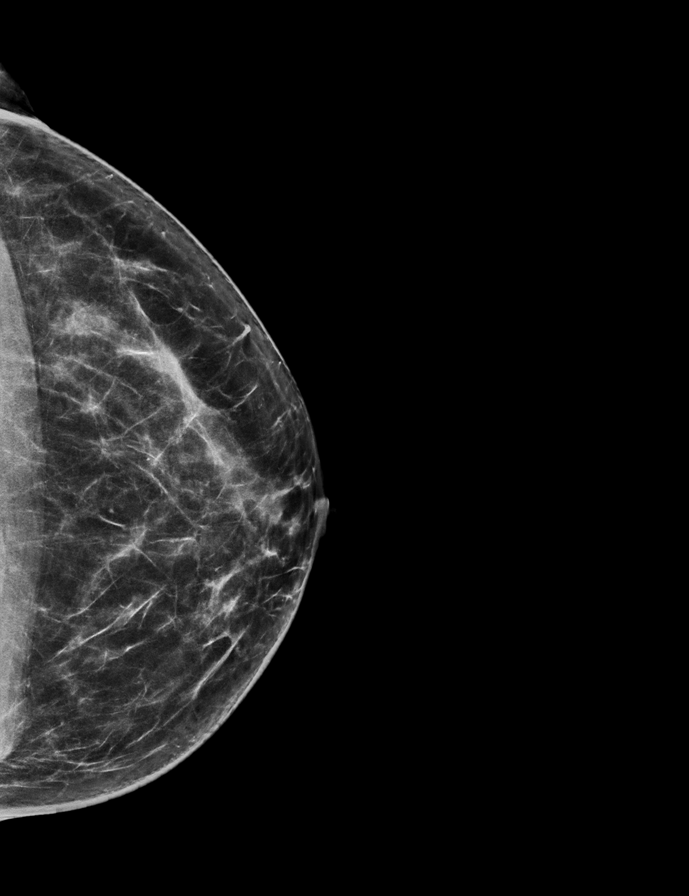

[R MLO synth-2D]
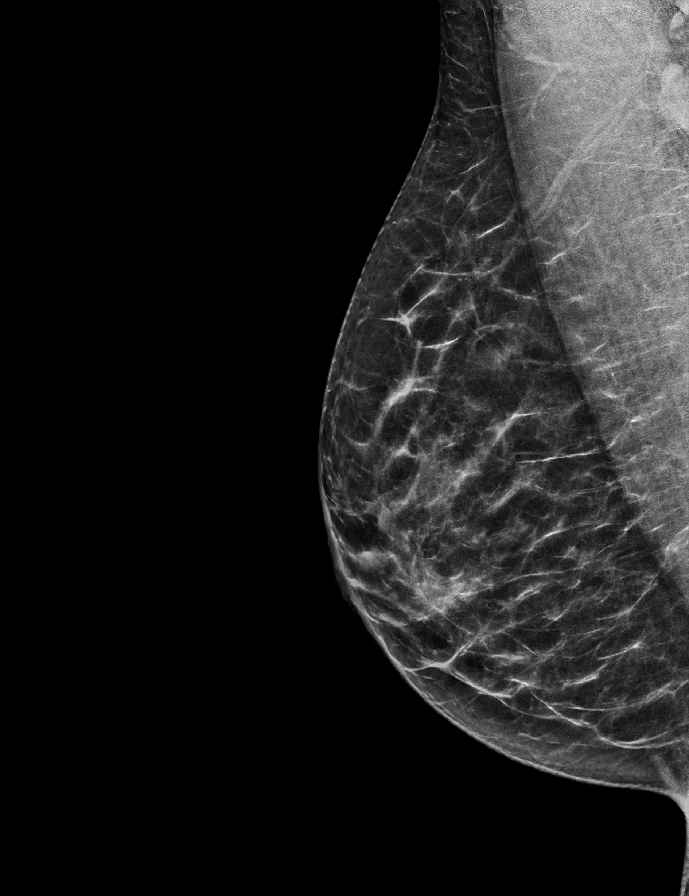

[L MLO synth-2D]
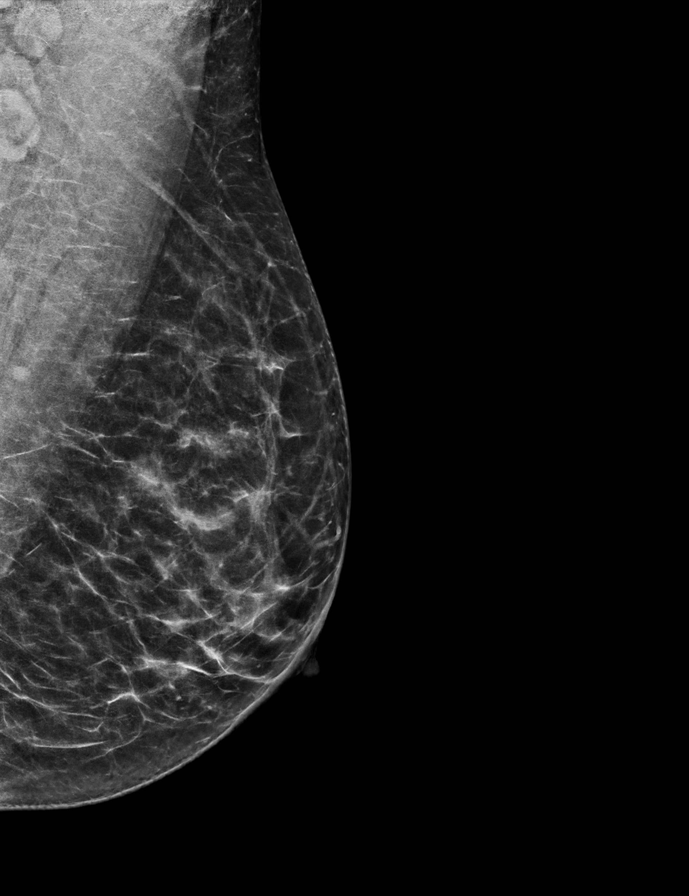

[R CC synth-2D]
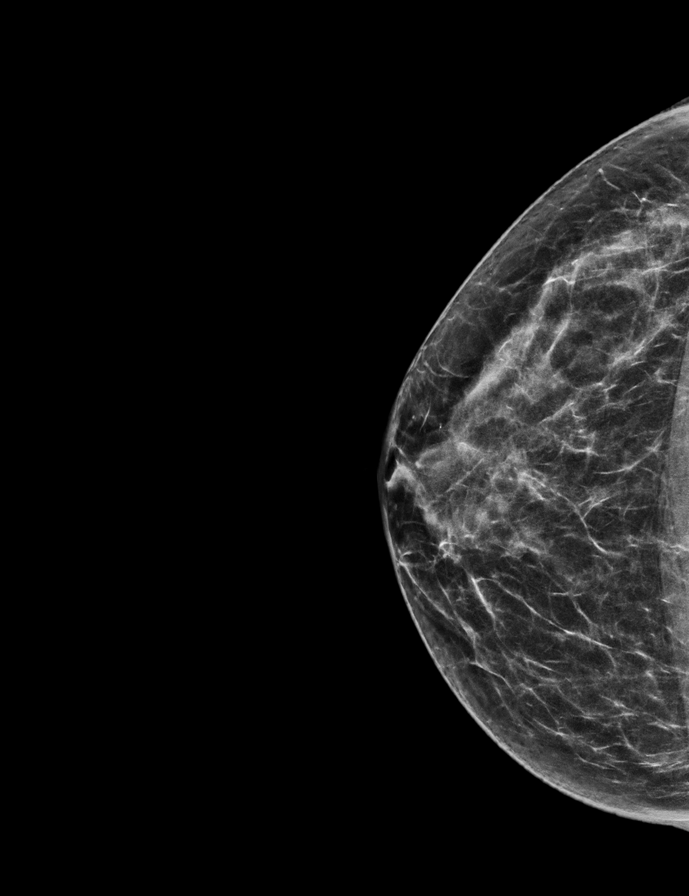

[L CC tomo · 2 of 63 frames shown]
[frame 21/63]
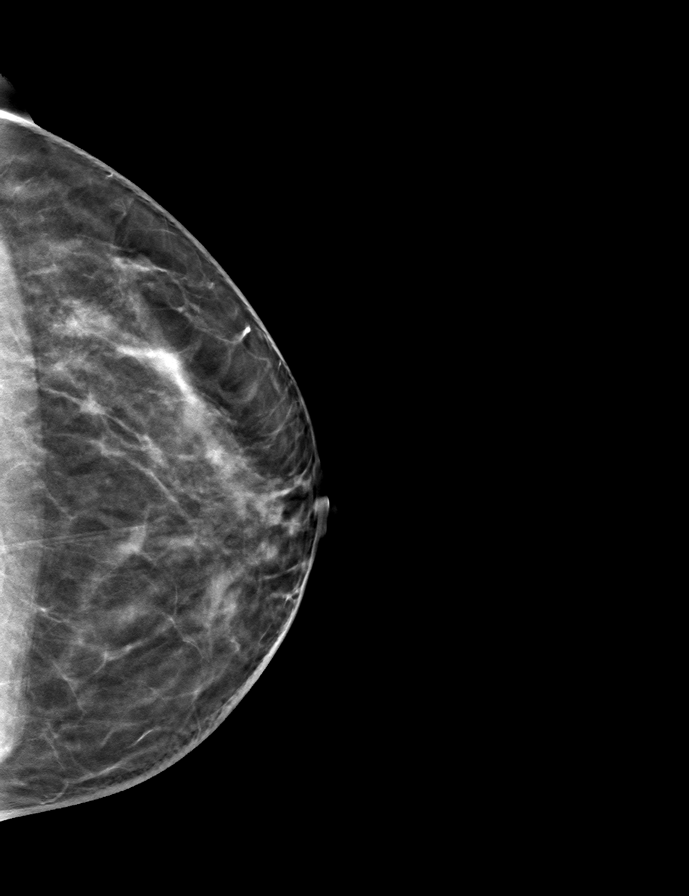
[frame 32/63]
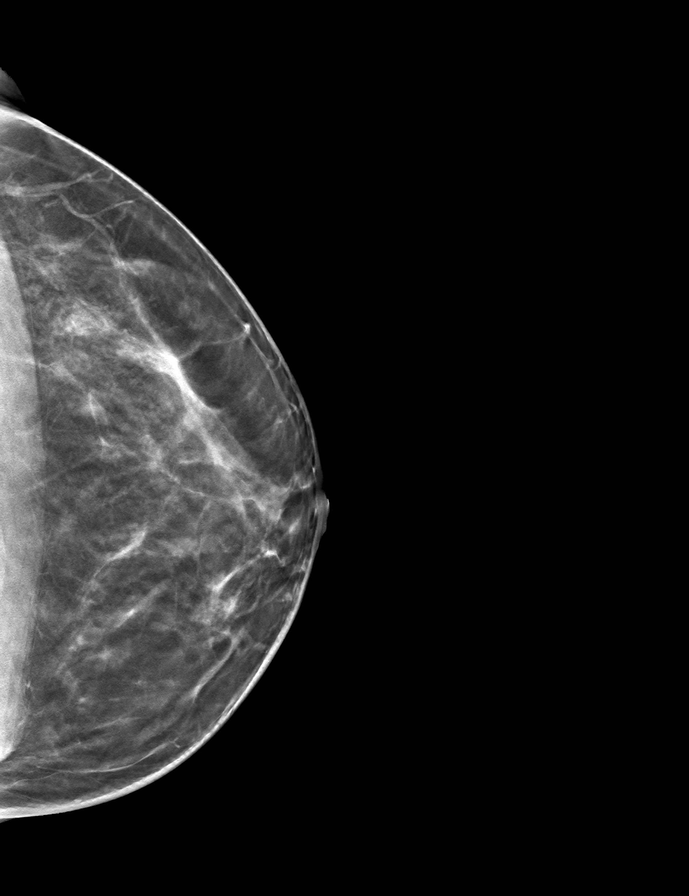

[L MLO tomo · tomo slice 29/57.0]
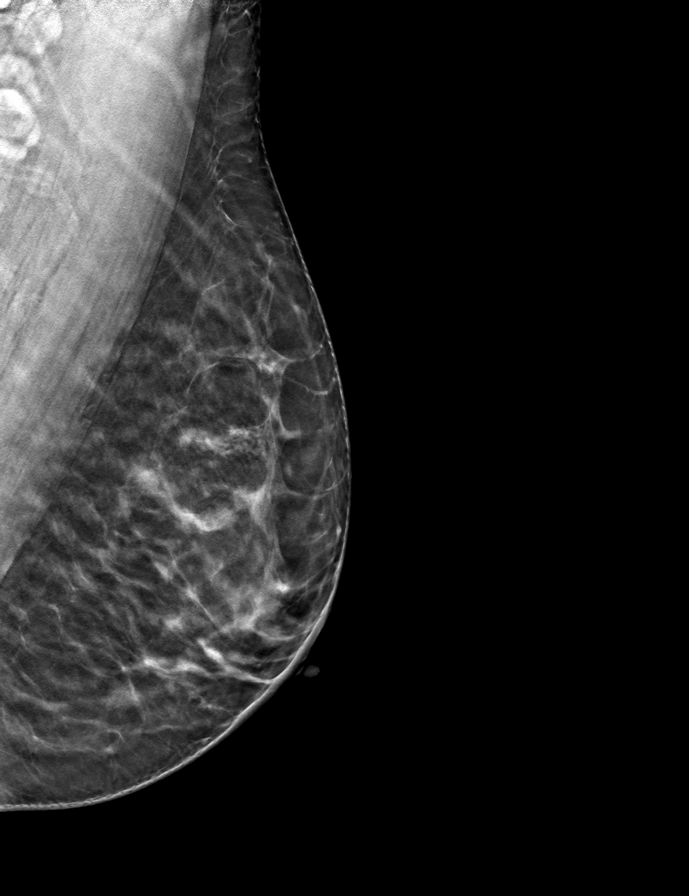

[R CC tomo · tomo slice 29/57.0]
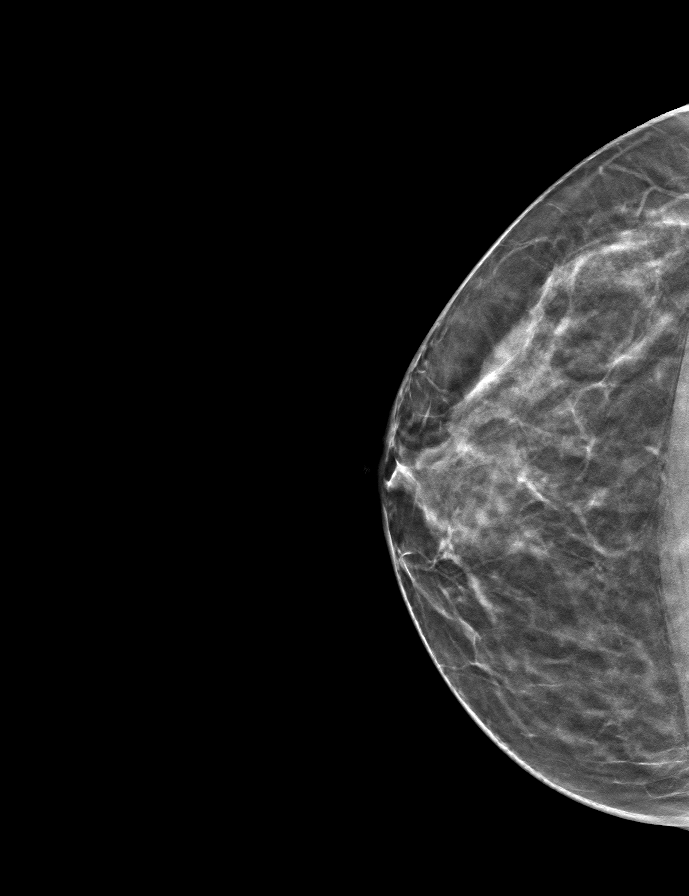

[R MLO tomo · tomo slice 29/58.0]
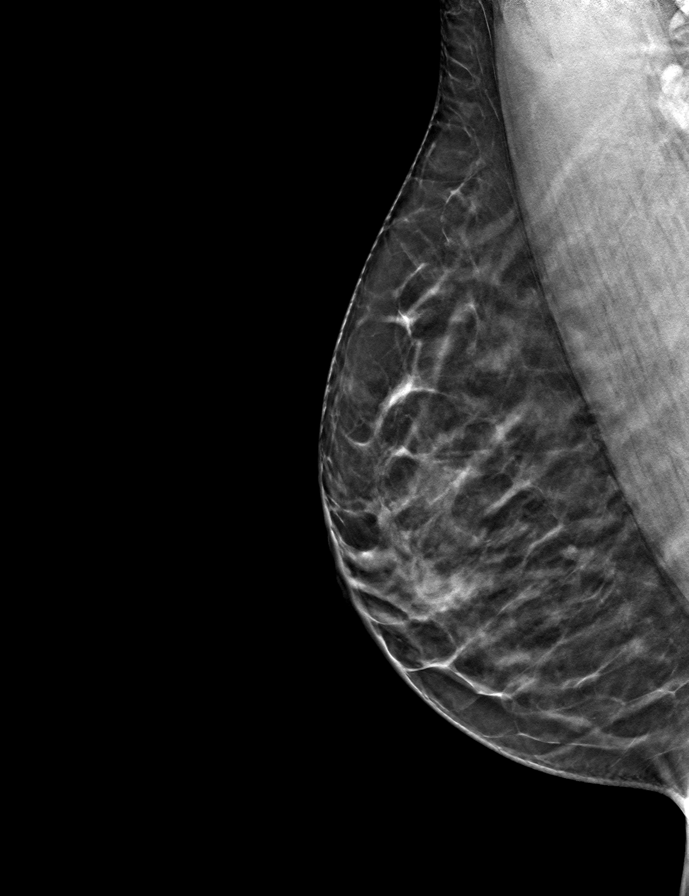

[9 of 24 positions shown; findings below may reference images not displayed]

ACR Breast Density Category b: There are scattered areas of
fibroglandular density.
FINDINGS: There are no findings suspicious for malignancy.
IMPRESSION: No mammographic evidence of malignancy. A result letter of this
screening mammogram will be mailed directly to the patient.

RECOMMENDATION:
Screening mammogram at age 39. (Code:[id number]-G-QW3)

BI-RADS CATEGORY  1: Negative.

## 2022-04-11 ENCOUNTER — Ambulatory Visit
Admission: EM | Admit: 2022-04-11 | Discharge: 2022-04-11 | Disposition: A | Discharge status: Home / Self Care | Payer: Managed Care, Other (non HMO) | Attending: Emergency Medicine | Admitting: Emergency Medicine

## 2022-04-11 DIAGNOSIS — J209 Acute bronchitis, unspecified: Secondary | ICD-10-CM | POA: Diagnosis not present

## 2022-04-11 MED ORDER — ALBUTEROL SULFATE HFA 108 (90 BASE) MCG/ACT IN AERS: 1.0000 | INHALATION_SPRAY | Freq: Four times a day (QID) | RESPIRATORY_TRACT | 0 refills | Status: DC | PRN

## 2022-04-11 MED ORDER — PREDNISONE 10 MG (21) PO TBPK: ORAL_TABLET | Freq: Every day | ORAL | 0 refills | Status: DC

## 2022-04-11 MED ORDER — AZITHROMYCIN 250 MG PO TABS: 250.0000 mg | ORAL_TABLET | Freq: Every day | ORAL | 0 refills | Status: DC

## 2022-04-11 NOTE — ED Triage Notes (Signed)
Pt presents with complaints of chest congestion and cough x 1 week. Reports it getting worse since then. Reports minimal relief with otc medications and inhaler.

## 2022-04-22 ENCOUNTER — Ambulatory Visit
Admission: RE | Admit: 2022-04-22 | Discharge: 2022-04-22 | Disposition: A | Discharge status: Home / Self Care | Payer: Managed Care, Other (non HMO) | Source: Ambulatory Visit | Attending: Obstetrics and Gynecology | Admitting: Obstetrics and Gynecology

## 2022-04-22 DIAGNOSIS — Z803 Family history of malignant neoplasm of breast: Secondary | ICD-10-CM | POA: Insufficient documentation

## 2022-04-22 DIAGNOSIS — Z1239 Encounter for other screening for malignant neoplasm of breast: Secondary | ICD-10-CM | POA: Insufficient documentation

## 2022-04-22 MED ORDER — GADOBUTROL 1 MMOL/ML IV SOLN
6.0000 mL | Freq: Once | INTRAVENOUS | Status: AC | PRN
  Administered 2022-04-22: 6 mL via INTRAVENOUS

## 2022-10-07 ENCOUNTER — Other Ambulatory Visit: Payer: Self-pay | Admitting: Obstetrics and Gynecology

## 2022-10-07 DIAGNOSIS — Z1231 Encounter for screening mammogram for malignant neoplasm of breast: Secondary | ICD-10-CM

## 2022-11-04 ENCOUNTER — Ambulatory Visit
Admission: RE | Admit: 2022-11-04 | Discharge: 2022-11-04 | Disposition: A | Discharge status: Home / Self Care | Payer: Managed Care, Other (non HMO) | Source: Ambulatory Visit | Attending: Obstetrics and Gynecology | Admitting: Obstetrics and Gynecology

## 2022-11-04 DIAGNOSIS — Z1231 Encounter for screening mammogram for malignant neoplasm of breast: Secondary | ICD-10-CM

## 2022-11-18 ENCOUNTER — Ambulatory Visit (INDEPENDENT_AMBULATORY_CARE_PROVIDER_SITE_OTHER): Payer: Managed Care, Other (non HMO) | Admitting: Family Medicine

## 2022-11-18 ENCOUNTER — Encounter: Payer: Self-pay | Admitting: Family Medicine

## 2022-11-18 VITALS — BP 112/66 | HR 80 | Ht 64.0 in | Wt 156.4 lb

## 2022-11-18 DIAGNOSIS — Z131 Encounter for screening for diabetes mellitus: Secondary | ICD-10-CM

## 2022-11-18 DIAGNOSIS — Z1159 Encounter for screening for other viral diseases: Secondary | ICD-10-CM | POA: Diagnosis not present

## 2022-11-18 DIAGNOSIS — Z833 Family history of diabetes mellitus: Secondary | ICD-10-CM | POA: Diagnosis not present

## 2022-11-18 DIAGNOSIS — Z23 Encounter for immunization: Secondary | ICD-10-CM | POA: Diagnosis not present

## 2022-11-18 DIAGNOSIS — Z0000 Encounter for general adult medical examination without abnormal findings: Secondary | ICD-10-CM

## 2022-11-18 NOTE — Patient Instructions (Addendum)
Thank you for coming to the office today.  Labs today  Completed Tdap today! Good for 10 years.  Keep up the great work!  DUE for FASTING BLOOD WORK (no food or drink after midnight before the lab appointment, only water or coffee without cream/sugar on the morning of)  Can draw lab AFTER visit  Please schedule a Follow-up Appointment to: Return in about 1 year (around 11/19/2023) for 1 year Annual Physical AM apt fasting lab after.  If you have any other questions or concerns, please feel free to call the office or send a message through [facility]. You may also schedule an earlier appointment if necessary.  Additionally, you may be receiving a survey about your experience at our office within a few days to 1 week by e-mail or mail. We value your feedback.  Nobie Putnam, DO [place]

## 2022-11-18 NOTE — Progress Notes (Signed)
Subjective:    Patient ID: Melissa Pierce, female    DOB: May 22, 1986, 36 y.o.   MRN: [id number]  Melissa Pierce is a 37 y.o. female presenting on 11/18/2022 for Annual Exam   HPI  Here for Annual Physical and Lab Orders.  History of Anemia ADHD Past history in prior pregnancy 10 yr ago had anemia Not on iron or supplement for this  Admits some Fatigue / Tired at times, thinks related to ADHD medication when come off med Followed by Psychiatry  Lifestyle Exercise Work out daily - cardio and mix of strength training Balanced healthy diet  Additional history  Lumbar DDD Pars Defect L5-S1 X-ray followed by Chiropractor Small bone spur L4-L5 History of bulged disc last year [place] Maintenance: Due for Tdap Due for routine Hep C screen Last pap 05/20/21, due 2025 Mammogram surveillance per GYN due to strong fam history breast cancer.     11/18/2022   10:08 AM 05/20/2021    8:44 AM 11/25/2019    2:46 PM  Depression screen PHQ 2/9  Decreased Interest 0 0 0  Down, Depressed, Hopeless 0 0 0  PHQ - 2 Score 0 0 0  Altered sleeping  1   Tired, decreased energy  1   Change in appetite  0   Feeling bad or failure about yourself   0   Trouble concentrating  0   Moving slowly or fidgety/restless  0   Suicidal thoughts  0   PHQ-9 Score  2   Difficult doing work/chores  Not difficult at all        No data to display            Past Medical History:  Diagnosis Date   ADHD    Dysmenorrhea    Family history of breast cancer 09/2014   BRCA neg; IBIS=22%; update testing letter sent. Direct discussion on 09/20/2019   Ganglion of foot, left 02/14/2017   Ganglion of foot, right 02/14/2017   Increased risk of breast cancer    Tarsal tunnel syndrome    Determined only ganglian cyst   Past Surgical History:  Procedure Laterality Date   APPENDECTOMY     APPENDECTOMY  02/22/2012   CESAREAN SECTION     CESAREAN  SECTION  06/12/2013   WISDOM TOOTH EXTRACTION     Social History   Socioeconomic History   Marital status: Married    Spouse name: Not on file   Number of children: Not on file   Years of education: Not on file   Highest education level: Not on file  Occupational History   Not on file  Tobacco Use   Smoking status: Former   Smokeless tobacco: Never  Vaping Use   Vaping Use: Never used  Substance and Sexual Activity   Alcohol use: Yes    Alcohol/week: 1.0 standard drink of alcohol    Types: 1 Glasses of wine per week    Comment: occasionally   Drug use: No   Sexual activity: Yes    Birth control/protection: Pill  Other Topics Concern   Not on file  Social History Narrative   ** Merged History Encounter **       Social Determinants of Health   Financial Resource Strain: Not on file  Food Insecurity: Not on file  Transportation Needs: Not on file  Physical Activity: Sufficiently Active (03/19/2018)   Exercise Vital Sign    Days of Exercise per  Week: 6 days    Minutes of Exercise per Session: 60 min  Stress: Not on file  Social Connections: Not on file  Intimate Partner Violence: Not on file   Family History  Problem Relation Age of Onset   Heart disease Father    Diabetes Father    Melanoma Father 54   Colon polyps Father    Pancreatic cancer Paternal Grandmother 34   Lung cancer Paternal Grandfather 58   Breast cancer Paternal Aunt 73       metastatic   Breast cancer Paternal Aunt        58s   Breast cancer Cousin 22       Paternal (Dgtr of PA w/Breast) metastatic     Current Outpatient Medications on File Prior to Visit  Medication Sig   atomoxetine (STRATTERA) 80 MG capsule Take 80 mg by mouth daily.   dextroamphetamine (DEXEDRINE SPANSULE) 15 MG 24 hr capsule Take 1 capsule (15 mg total) by mouth daily.   loratadine (CLARITIN) 10 MG tablet Take 10 mg by mouth daily.   sertraline (ZOLOFT) 25 MG tablet Take 25 mg by mouth daily.   albuterol (VENTOLIN  HFA) 108 (90 Base) MCG/ACT inhaler Inhale 1-2 puffs into the lungs every 6 (six) hours as needed for wheezing or shortness of breath. (Patient not taking: Reported on 11/18/2022)   No current facility-administered medications on file prior to visit.    Review of Systems  Constitutional:  Negative for activity change, appetite change, chills, diaphoresis, fatigue and fever.  HENT:  Negative for congestion and hearing loss.   Eyes:  Negative for visual disturbance.  Respiratory:  Negative for cough, chest tightness, shortness of breath and wheezing.   Cardiovascular:  Negative for chest pain, palpitations and leg swelling.  Gastrointestinal:  Negative for abdominal pain, constipation, diarrhea, nausea and vomiting.  Genitourinary:  Negative for dysuria, frequency and hematuria.  Musculoskeletal:  Negative for arthralgias and neck pain.  Skin:  Negative for rash.  Neurological:  Negative for dizziness, weakness, light-headedness, numbness and headaches.  Hematological:  Negative for adenopathy.  Psychiatric/Behavioral:  Negative for behavioral problems, dysphoric mood and sleep disturbance.    Per HPI unless specifically indicated above      Objective:    BP 112/66   Pulse 80   Ht 5\' 4"  (1.626 m)   Wt 156 lb 6.4 oz (70.9 kg)   LMP 10/21/2022   SpO2 99%   BMI 26.85 kg/m   Wt Readings from Last 3 Encounters:  11/18/22 156 lb 6.4 oz (70.9 kg)  05/20/21 151 lb (68.5 kg)  11/06/20 157 lb (71.2 kg)    Physical Exam Vitals and nursing note reviewed.  Constitutional:      General: She is not in acute distress.    Appearance: She is well-developed. She is not diaphoretic.     Comments: Well-appearing, comfortable, cooperative  HENT:     Head: Normocephalic and atraumatic.     Right Ear: Tympanic membrane, ear canal and external ear normal. There is no impacted cerumen.     Left Ear: Tympanic membrane, ear canal and external ear normal. There is no impacted cerumen.  Eyes:      General:        Right eye: No discharge.        Left eye: No discharge.     Conjunctiva/sclera: Conjunctivae normal.     Pupils: Pupils are equal, round, and reactive to light.  Neck:     Thyroid: No thyromegaly.  Vascular: No carotid bruit.  Cardiovascular:     Rate and Rhythm: Normal rate and regular rhythm.     Pulses: Normal pulses.     Heart sounds: Normal heart sounds. No murmur heard. Pulmonary:     Effort: Pulmonary effort is normal. No respiratory distress.     Breath sounds: Normal breath sounds. No wheezing or rales.  Abdominal:     General: Bowel sounds are normal. There is no distension.     Palpations: Abdomen is soft. There is no mass.     Tenderness: There is no abdominal tenderness.  Musculoskeletal:        General: No tenderness. Normal range of motion.     Cervical back: Normal range of motion and neck supple.     Right lower leg: No edema.     Left lower leg: No edema.     Comments: Upper / Lower Extremities: - Normal muscle tone, strength bilateral upper extremities 5/5, lower extremities 5/5  Lymphadenopathy:     Cervical: No cervical adenopathy.  Skin:    General: Skin is warm and dry.     Findings: No erythema or rash.  Neurological:     Mental Status: She is alert and oriented to person, place, and time.     Comments: Distal sensation intact to light touch all extremities  Psychiatric:        Mood and Affect: Mood normal.        Behavior: Behavior normal.        Thought Content: Thought content normal.     Comments: Well groomed, good eye contact, normal speech and thoughts      Results for orders placed or performed in visit on 05/20/21  Cytology - PAP  Result Value Ref Range   High risk HPV Negative    Adequacy      Satisfactory for evaluation; transformation zone component PRESENT.   Diagnosis      - Negative for intraepithelial lesion or malignancy (NILM)   Comment Normal Reference Range HPV - Negative       Assessment & Plan:    Problem List Items Addressed This Visit   None Visit Diagnoses     Annual physical exam    -  Primary   Relevant Orders   COMPLETE METABOLIC PANEL WITH GFR   CBC with Differential/Platelet   Lipid panel   Hemoglobin A1c   TSH   Screening for diabetes mellitus (DM)       Relevant Orders   Hemoglobin A1c   Family history of diabetes mellitus (DM)       Relevant Orders   Hemoglobin A1c   Need for hepatitis C screening test       Relevant Orders   Hepatitis C antibody   Need for diphtheria-tetanus-pertussis (Tdap) vaccine       Relevant Orders   Tdap vaccine greater than or equal to 7yo IM (Completed)       Updated Health Maintenance information Fasting lab today pending Encouraged improvement to lifestyle with diet and exercise Goal maintain healthy weight TDap vaccine today, good for 10 years.   No orders of the defined types were placed in this encounter.     Follow up plan: Return in about 1 year (around 11/19/2023) for 1 year Annual Physical AM apt fasting lab after.  Nobie Putnam, DO [facility] 11/18/2022, 9:51 AM

## 2022-11-20 LAB — COMPLETE METABOLIC PANEL WITH GFR
AG Ratio: 1.7 (calc) (ref 1.0–2.5)
ALT: 14 U/L (ref 6–29)
AST: 17 U/L (ref 10–30)
Albumin: 4.3 g/dL (ref 3.6–5.1)
Alkaline phosphatase (APISO): 76 U/L (ref 31–125)
BUN: 20 mg/dL (ref 7–25)
CO2: 27 mmol/L (ref 20–32)
Calcium: 10 mg/dL (ref 8.6–10.2)
Chloride: 105 mmol/L (ref 98–110)
Creat: 0.58 mg/dL (ref 0.50–0.97)
Globulin: 2.5 g/dL (calc) (ref 1.9–3.7)
Glucose, Bld: 89 mg/dL (ref 65–99)
Potassium: 5 mmol/L (ref 3.5–5.3)
Sodium: 140 mmol/L (ref 135–146)
Total Bilirubin: 0.4 mg/dL (ref 0.2–1.2)
Total Protein: 6.8 g/dL (ref 6.1–8.1)
eGFR: 119 mL/min/{1.73_m2} (ref >=60)

## 2022-11-20 LAB — CBC WITH DIFFERENTIAL/PLATELET
Absolute Monocytes: 503 cells/uL (ref 200–950)
Basophils Absolute: 40 cells/uL (ref 0–200)
Basophils Relative: 0.6 %
Eosinophils Absolute: 208 cells/uL (ref 15–500)
Eosinophils Relative: 3.1 %
HCT: 36.1 % (ref 35.0–45.0)
Hemoglobin: 12.3 g/dL (ref 11.7–15.5)
Lymphs Abs: 2104 cells/uL (ref 850–3900)
MCH: 32.5 pg (ref 27.0–33.0)
MCHC: 34.1 g/dL (ref 32.0–36.0)
MCV: 95.5 fL (ref 80.0–100.0)
MPV: 11.1 fL (ref 7.5–12.5)
Monocytes Relative: 7.5 %
Neutro Abs: 3846 cells/uL (ref 1500–7800)
Neutrophils Relative %: 57.4 %
Platelets: 303 10*3/uL (ref 140–400)
RBC: 3.78 10*6/uL — ABNORMAL LOW (ref 3.80–5.10)
RDW: 11.2 % (ref 11.0–15.0)
Total Lymphocyte: 31.4 %
WBC: 6.7 10*3/uL (ref 3.8–10.8)

## 2022-11-20 LAB — LIPID PANEL
Cholesterol: 143 mg/dL (ref <200)
HDL: 73 mg/dL (ref >=50)
LDL Cholesterol (Calc): 59 mg/dL (calc)
Non-HDL Cholesterol (Calc): 70 mg/dL (calc) (ref <130)
Total CHOL/HDL Ratio: 2 (calc) (ref <5.0)
Triglycerides: 37 mg/dL (ref <150)

## 2022-11-20 LAB — HEMOGLOBIN A1C
Hgb A1c MFr Bld: 5.2 % of total Hgb (ref <5.7)
Mean Plasma Glucose: 103 mg/dL
eAG (mmol/L): 5.7 mmol/L

## 2022-11-20 LAB — TSH: TSH: 0.71 mIU/L

## 2022-11-20 LAB — HEPATITIS C ANTIBODY: Hepatitis C Ab: NONREACTIVE

## 2023-04-14 ENCOUNTER — Other Ambulatory Visit: Payer: Self-pay | Admitting: Obstetrics and Gynecology

## 2023-04-14 DIAGNOSIS — Z803 Family history of malignant neoplasm of breast: Secondary | ICD-10-CM

## 2023-04-14 DIAGNOSIS — Z1239 Encounter for other screening for malignant neoplasm of breast: Secondary | ICD-10-CM

## 2023-04-28 ENCOUNTER — Ambulatory Visit
Admission: RE | Admit: 2023-04-28 | Discharge: 2023-04-28 | Disposition: A | Discharge status: Home / Self Care | Payer: Managed Care, Other (non HMO) | Source: Ambulatory Visit | Attending: Obstetrics and Gynecology | Admitting: Obstetrics and Gynecology

## 2023-04-28 ENCOUNTER — Other Ambulatory Visit: Payer: Managed Care, Other (non HMO)

## 2023-04-28 DIAGNOSIS — Z1239 Encounter for other screening for malignant neoplasm of breast: Secondary | ICD-10-CM | POA: Diagnosis present

## 2023-04-28 DIAGNOSIS — Z803 Family history of malignant neoplasm of breast: Secondary | ICD-10-CM | POA: Diagnosis present

## 2023-04-28 MED ORDER — GADOBUTROL 1 MMOL/ML IV SOLN
7.0000 mL | Freq: Once | INTRAVENOUS | Status: AC | PRN
Start: 2023-04-28 — End: 2023-04-28
  Administered 2023-04-28: 7 mL via INTRAVENOUS

## 2023-06-02 ENCOUNTER — Ambulatory Visit
Admission: EM | Admit: 2023-06-02 | Discharge: 2023-06-02 | Disposition: A | Discharge status: Home / Self Care | Payer: Managed Care, Other (non HMO) | Attending: Urgent Care | Admitting: Urgent Care

## 2023-06-02 DIAGNOSIS — J0100 Acute maxillary sinusitis, unspecified: Secondary | ICD-10-CM

## 2023-06-02 MED ORDER — PREDNISONE 50 MG PO TABS: 50.0000 mg | ORAL_TABLET | Freq: Every day | ORAL | 0 refills | Status: AC

## 2023-06-02 MED ORDER — AMOXICILLIN-POT CLAVULANATE 875-125 MG PO TABS: 1.0000 | ORAL_TABLET | Freq: Two times a day (BID) | ORAL | 0 refills | Status: DC

## 2023-06-02 NOTE — Discharge Instructions (Signed)
Follow up here or with your primary care provider if your symptoms are worsening or not improving with treatment.     

## 2023-06-02 NOTE — ED Provider Notes (Signed)
Melissa Pierce    CSN: [id number] Arrival date & time: 06/02/23  1246      History   Chief Complaint Chief Complaint  Patient presents with   Facial Pain    HPI Melissa Pierce is a 37 y.o. female.   HPI  Presents urgent care with concern for chest congestion, ears popping, left ear pain, head congestion, dizziness (head spinning) x 3 days.  She is treating symptoms with ibuprofen.   Past Medical History:  Diagnosis Date   ADHD    Dysmenorrhea    Family history of breast cancer 09/2014   BRCA neg; IBIS=22%; update testing letter sent. Direct discussion on 09/20/2019   Ganglion of foot, left 02/14/2017   Ganglion of foot, right 02/14/2017   Increased risk of breast cancer    Tarsal tunnel syndrome    Determined only ganglian cyst    Patient Active Problem List   Diagnosis Date Noted   Family history of breast cancer 03/19/2018   ADHD    Paresthesia of foot, bilateral 08/09/2016    Past Surgical History:  Procedure Laterality Date   APPENDECTOMY     APPENDECTOMY  02/22/2012   CESAREAN SECTION     CESAREAN SECTION  06/12/2013   WISDOM TOOTH EXTRACTION      OB History     Gravida  4   Para  2   Term  2   Preterm  0   AB  2   Living         SAB  2   IAB  0   Ectopic  0   Multiple      Live Births               Home Medications    Prior to Admission medications   Medication Sig Start Date End Date Taking? Authorizing Provider  albuterol (VENTOLIN HFA) 108 (90 Base) MCG/ACT inhaler Inhale 1-2 puffs into the lungs every 6 (six) hours as needed for wheezing or shortness of breath. Patient not taking: Reported on 11/18/2022 04/11/22   Mickie Bail, NP  atomoxetine (STRATTERA) 80 MG capsule Take 80 mg by mouth daily.    [provider]  dextroamphetamine (DEXEDRINE SPANSULE) 15 MG 24 hr capsule Take 1 capsule (15 mg total) by mouth daily. 03/05/18   Karamalegos, Netta Neat, DO  loratadine (CLARITIN) 10 MG tablet Take  10 mg by mouth daily.    [provider]  sertraline (ZOLOFT) 25 MG tablet Take 25 mg by mouth daily.    [provider]    Family History Family History  Problem Relation Age of Onset   Heart disease Father    Diabetes Father    Melanoma Father 69   Colon polyps Father    Pancreatic cancer Paternal Grandmother 16   Lung cancer Paternal Grandfather 8   Breast cancer Paternal Aunt 57       metastatic   Breast cancer Paternal Aunt        51s   Breast cancer Cousin 6       Paternal (Dgtr of [place] w/Breast) metastatic    Social History Social History   Tobacco Use   Smoking status: Former   Smokeless tobacco: Never  Vaping Use   Vaping status: Never Used  Substance Use Topics   Alcohol use: Yes    Alcohol/week: 1.0 standard drink of alcohol    Types: 1 Glasses of wine per week    Comment: occasionally  Drug use: No     Allergies   Iodides   Review of Systems Review of Systems   Physical Exam Triage Vital Signs ED Triage Vitals  Encounter Vitals Group     BP 06/02/23 1320 (!) 147/85     Systolic BP Percentile --      Diastolic BP Percentile --      Pulse --      Resp 06/02/23 1320 16     Temp 06/02/23 1320 (!) 97.5 F (36.4 C)     Temp Source 06/02/23 1320 Temporal     SpO2 06/02/23 1320 98 %     Weight --      Height --      Head Circumference --      Peak Flow --      Pain Score 06/02/23 1321 7     Pain Loc --      Pain Education --      Exclude from Growth Chart --    No data found.  Updated Vital Signs BP (!) 147/85 (BP Location: Left Arm)   Temp (!) 97.5 F (36.4 C) (Temporal)   Resp 16   LMP 06/02/2023 (Exact Date)   SpO2 98%   Visual Acuity Right Eye Distance:   Left Eye Distance:   Bilateral Distance:    Right Eye Near:   Left Eye Near:    Bilateral Near:     Physical Exam Vitals reviewed.  Constitutional:      Appearance: Normal appearance.  HENT:     Right Ear: A middle ear effusion is present. Tympanic  membrane is bulging.     Left Ear: A middle ear effusion is present. Tympanic membrane is bulging.  Skin:    General: Skin is warm and dry.  Neurological:     General: No focal deficit present.     Mental Status: She is alert and oriented to person, place, and time.  Psychiatric:        Mood and Affect: Mood normal.        Behavior: Behavior normal.      UC Treatments / Results  Labs (all labs ordered are listed, but only abnormal results are displayed) Labs Reviewed - No data to display  EKG   Radiology No results found.  Procedures Procedures (including critical care time)  Medications Ordered in UC Medications - No data to display  Initial Impression / Assessment and Plan / UC Course  I have reviewed the triage vital signs and the nursing notes.  Pertinent labs & imaging results that were available during my care of the patient were reviewed by me and considered in my medical decision making (see chart for details).   Melissa Pierce is a 37 y.o. female presenting with URI symptoms with sinusitis. Patient is afebrile without recent antipyretics, satting well on room air. Overall is well appearing, well hydrated, without respiratory distress. Pulmonary exam is unremarkable.  Lungs CTAB without wheezing, rhonchi, rales. RRR without murmurs, rubs, gallops.  Middle ear effusion is present bilaterally with bulging TMs.  TMs are nonerythematous.  Reviewed relevant chart history.   Patient's symptoms are consistent with an upper respiratory symptoms with severe sinusitis.  Treating her with Augmentin as well as a corticosteroid to relieve her sinus inflammation.  Counseled patient on potential for adverse effects with medications prescribed/recommended today, ER and return-to-clinic precautions discussed, patient verbalized understanding and agreement with care plan.  Final Clinical Impressions(s) / UC Diagnoses   Final diagnoses:  None   Discharge Instructions    None    ED Prescriptions   None    PDMP not reviewed this encounter.   Charma Igo, FNP 06/02/23 1344

## 2023-06-02 NOTE — ED Triage Notes (Signed)
Patient presents to UC for chest congestion, ears popping, left ear pain, and head congestion x 3 days. States she is treating with iburpofen.

## 2023-06-21 ENCOUNTER — Other Ambulatory Visit: Payer: Self-pay | Admitting: Obstetrics and Gynecology

## 2023-06-21 DIAGNOSIS — Z1239 Encounter for other screening for malignant neoplasm of breast: Secondary | ICD-10-CM

## 2023-06-21 DIAGNOSIS — Z1231 Encounter for screening mammogram for malignant neoplasm of breast: Secondary | ICD-10-CM

## 2023-11-10 ENCOUNTER — Ambulatory Visit
Admission: RE | Admit: 2023-11-10 | Discharge: 2023-11-10 | Disposition: A | Discharge status: Home / Self Care | Payer: Managed Care, Other (non HMO) | Source: Ambulatory Visit | Attending: Obstetrics and Gynecology | Admitting: Obstetrics and Gynecology

## 2023-11-10 DIAGNOSIS — Z1239 Encounter for other screening for malignant neoplasm of breast: Secondary | ICD-10-CM | POA: Diagnosis present

## 2023-11-10 DIAGNOSIS — Z1231 Encounter for screening mammogram for malignant neoplasm of breast: Secondary | ICD-10-CM | POA: Insufficient documentation

## 2024-03-11 ENCOUNTER — Ambulatory Visit
Admission: EM | Admit: 2024-03-11 | Discharge: 2024-03-11 | Disposition: A | Discharge status: Home / Self Care | Attending: Emergency Medicine | Admitting: Emergency Medicine

## 2024-03-11 ENCOUNTER — Other Ambulatory Visit: Payer: Self-pay

## 2024-03-11 ENCOUNTER — Encounter: Payer: Self-pay | Admitting: Emergency Medicine

## 2024-03-11 DIAGNOSIS — J0100 Acute maxillary sinusitis, unspecified: Secondary | ICD-10-CM | POA: Diagnosis not present

## 2024-03-11 MED ORDER — AMOXICILLIN-POT CLAVULANATE 875-125 MG PO TABS: 1.0000 | ORAL_TABLET | Freq: Two times a day (BID) | ORAL | 0 refills | Status: DC

## 2024-03-11 NOTE — ED Provider Notes (Signed)
 Arlander Bellman    CSN: [id number] Arrival date & time: 03/11/24  1114      History   Chief Complaint Chief Complaint  Patient presents with   Cough    HPI Melissa Pierce is a 38 y.o. female.  Patient presents with 4-5 day history of congestion, hoarse voice, sore throat, cough, fatigue.  No fever or shortness of breath.  She has been treating her symptoms with Tylenol and ibuprofen.  The history is provided by the patient and medical records.    Past Medical History:  Diagnosis Date   ADHD    Dysmenorrhea    Family history of breast cancer 09/2014   BRCA neg; IBIS=22%; update testing letter sent. Direct discussion on 09/20/2019   Ganglion of foot, left 02/14/2017   Ganglion of foot, right 02/14/2017   Increased risk of breast cancer    Tarsal tunnel syndrome    Determined only ganglian cyst    Patient Active Problem List   Diagnosis Date Noted   Family history of breast cancer 03/19/2018   ADHD    Paresthesia of foot, bilateral 08/09/2016    Past Surgical History:  Procedure Laterality Date   APPENDECTOMY     APPENDECTOMY  02/22/2012   CESAREAN SECTION     CESAREAN SECTION  06/12/2013   WISDOM TOOTH EXTRACTION      OB History     Gravida  4   Para  2   Term  2   Preterm  0   AB  2   Living         SAB  2   IAB  0   Ectopic  0   Multiple      Live Births               Home Medications    Prior to Admission medications   Medication Sig Start Date End Date Taking? Authorizing Provider  albuterol  (VENTOLIN  HFA) 108 (90 Base) MCG/ACT inhaler Inhale 1-2 puffs into the lungs every 6 (six) hours as needed for wheezing or shortness of breath. Patient not taking: Reported on 11/18/2022 04/11/22   Wellington Half, NP  amoxicillin -clavulanate (AUGMENTIN ) 875-125 MG tablet Take 1 tablet by mouth every 12 (twelve) hours. 03/11/24   Wellington Half, NP  atomoxetine (STRATTERA) 80 MG capsule Take 80 mg by mouth daily.    [provider]  dextroamphetamine (DEXEDRINE SPANSULE) 15 MG 24 hr capsule Take 1 capsule (15 mg total) by mouth daily. 03/05/18   Karamalegos, Kayleen Party, DO  loratadine (CLARITIN) 10 MG tablet Take 10 mg by mouth daily.    [provider]  sertraline (ZOLOFT) 25 MG tablet Take 25 mg by mouth daily.    [provider]    Family History Family History  Problem Relation Age of Onset   Heart disease Father    Diabetes Father    Melanoma Father 25   Colon polyps Father    Pancreatic cancer Paternal Grandmother 40   Lung cancer Paternal Grandfather 37   Breast cancer Paternal Aunt 21       metastatic   Breast cancer Paternal Aunt        80s   Breast cancer Cousin 30       Paternal (Dgtr of [place] w/Breast) metastatic    Social History Social History   Tobacco Use   Smoking status: Former   Smokeless tobacco: Never  Vaping Use   Vaping status: Never Used  Substance Use Topics   Alcohol use: Yes    Alcohol/week: 1.0 standard drink of alcohol    Types: 1 Glasses of wine per week    Comment: occasionally   Drug use: No     Allergies   Iodides   Review of Systems Review of Systems  Constitutional:  Positive for fatigue. Negative for chills and fever.  HENT:  Positive for congestion, postnasal drip, sore throat and voice change. Negative for ear pain.   Respiratory:  Positive for cough. Negative for shortness of breath.      Physical Exam Triage Vital Signs ED Triage Vitals  Encounter Vitals Group     BP 03/11/24 1206 136/85     Systolic BP Percentile --      Diastolic BP Percentile --      Pulse Rate 03/11/24 1206 79     Resp 03/11/24 1206 18     Temp 03/11/24 1206 98.5 F (36.9 C)     Temp src --      SpO2 03/11/24 1206 96 %     Weight --      Height --      Head Circumference --      Peak Flow --      Pain Score 03/11/24 1212 7     Pain Loc --      Pain Education --      Exclude from Growth Chart --    No data found.  Updated Vital  Signs BP 136/85   Pulse 79   Temp 98.5 F (36.9 C)   Resp 18   LMP 03/08/2024   SpO2 96%   Visual Acuity Right Eye Distance:   Left Eye Distance:   Bilateral Distance:    Right Eye Near:   Left Eye Near:    Bilateral Near:     Physical Exam Constitutional:      General: She is not in acute distress. HENT:     Right Ear: Tympanic membrane normal.     Left Ear: Tympanic membrane normal.     Nose: Congestion present.     Mouth/Throat:     Mouth: Mucous membranes are moist.     Pharynx: Oropharynx is clear.  Cardiovascular:     Rate and Rhythm: Normal rate and regular rhythm.     Heart sounds: Normal heart sounds.  Pulmonary:     Effort: Pulmonary effort is normal. No respiratory distress.     Breath sounds: Normal breath sounds.  Neurological:     Mental Status: She is alert.      UC Treatments / Results  Labs (all labs ordered are listed, but only abnormal results are displayed) Labs Reviewed - No data to display  EKG   Radiology No results found.  Procedures Procedures (including critical care time)  Medications Ordered in UC Medications - No data to display  Initial Impression / Assessment and Plan / UC Course  I have reviewed the triage vital signs and the nursing notes.  Pertinent labs & imaging results that were available during my care of the patient were reviewed by me and considered in my medical decision making (see chart for details).    Acute sinusitis.  Afebrile and vital signs are stable.  Lungs are clear and O2 sat is 96% on room air.  Treating today with Augmentin .  Tylenol or ibuprofen as needed.  Plain Mucinex as needed.  Instructed patient to follow up with her PCP if her symptoms are not improving.  Education provided on sinus infection.  She agrees to plan of care.   Final Clinical Impressions(s) / UC Diagnoses   Final diagnoses:  Acute non-recurrent maxillary sinusitis     Discharge Instructions      Take the Augmentin  as  directed.  Follow-up with your primary care provider if your symptoms are not improving.    ED Prescriptions     Medication Sig Dispense Auth. Provider   amoxicillin -clavulanate (AUGMENTIN ) 875-125 MG tablet  (Status: Discontinued) Take 1 tablet by mouth every 12 (twelve) hours. 14 tablet Wellington Half, NP   amoxicillin -clavulanate (AUGMENTIN ) 875-125 MG tablet Take 1 tablet by mouth every 12 (twelve) hours. 14 tablet Wellington Half, NP      PDMP not reviewed this encounter.   Wellington Half, NP 03/11/24 [phone]

## 2024-03-11 NOTE — ED Triage Notes (Signed)
 Symptoms started 4 days ago.  Reports sore throat, feels like she is choking when lying down.  Has laryngitis.  Head and chest congestion.  Complains of fatigue.  Patient has a cough, minimal production and appears to be yellow per patient.  Has had tylenol and ibuprofen

## 2024-03-11 NOTE — Discharge Instructions (Addendum)
 Take the Augmentin as directed.  Follow up with your primary care provider if your symptoms are not improving.

## 2024-05-09 ENCOUNTER — Other Ambulatory Visit: Payer: Self-pay | Admitting: Obstetrics and Gynecology

## 2024-05-09 DIAGNOSIS — Z9189 Other specified personal risk factors, not elsewhere classified: Secondary | ICD-10-CM

## 2024-05-09 DIAGNOSIS — Z803 Family history of malignant neoplasm of breast: Secondary | ICD-10-CM

## 2024-05-13 ENCOUNTER — Other Ambulatory Visit: Payer: Self-pay | Admitting: Orthopedic Surgery

## 2024-05-13 DIAGNOSIS — S43204A Unspecified dislocation of right sternoclavicular joint, initial encounter: Secondary | ICD-10-CM

## 2024-05-14 ENCOUNTER — Ambulatory Visit
Admission: RE | Admit: 2024-05-14 | Discharge: 2024-05-14 | Disposition: A | Discharge status: Home / Self Care | Source: Ambulatory Visit | Attending: Obstetrics and Gynecology | Admitting: Obstetrics and Gynecology

## 2024-05-14 DIAGNOSIS — Z803 Family history of malignant neoplasm of breast: Secondary | ICD-10-CM | POA: Diagnosis not present

## 2024-05-14 DIAGNOSIS — Z9189 Other specified personal risk factors, not elsewhere classified: Secondary | ICD-10-CM | POA: Insufficient documentation

## 2024-05-14 DIAGNOSIS — Z1231 Encounter for screening mammogram for malignant neoplasm of breast: Secondary | ICD-10-CM | POA: Diagnosis not present

## 2024-05-14 DIAGNOSIS — Z1239 Encounter for other screening for malignant neoplasm of breast: Secondary | ICD-10-CM | POA: Diagnosis present

## 2024-05-14 MED ORDER — GADOBUTROL 1 MMOL/ML IV SOLN
7.0000 mL | Freq: Once | INTRAVENOUS | Status: AC | PRN
  Administered 2024-05-14: 7 mL via INTRAVENOUS

## 2024-05-16 ENCOUNTER — Inpatient Hospital Stay
Admission: RE | Admit: 2024-05-16 | Discharge: 2024-05-16 | Discharge status: Home / Self Care | Source: Ambulatory Visit | Attending: Orthopedic Surgery

## 2024-05-16 DIAGNOSIS — S43204A Unspecified dislocation of right sternoclavicular joint, initial encounter: Secondary | ICD-10-CM

## 2024-06-10 ENCOUNTER — Ambulatory Visit
Admission: RE | Admit: 2024-06-10 | Discharge: 2024-06-10 | Disposition: A | Discharge status: Home / Self Care | Source: Ambulatory Visit | Attending: Emergency Medicine | Admitting: Emergency Medicine

## 2024-06-10 DIAGNOSIS — J039 Acute tonsillitis, unspecified: Secondary | ICD-10-CM | POA: Insufficient documentation

## 2024-06-10 DIAGNOSIS — J029 Acute pharyngitis, unspecified: Secondary | ICD-10-CM | POA: Diagnosis present

## 2024-06-10 LAB — POCT RAPID STREP A (OFFICE): Rapid Strep A Screen: NEGATIVE

## 2024-06-10 MED ORDER — AMOXICILLIN 500 MG PO CAPS: 500.0000 mg | ORAL_CAPSULE | Freq: Two times a day (BID) | ORAL | 0 refills | Status: AC

## 2024-06-10 NOTE — ED Provider Notes (Signed)
 Melissa Pierce    CSN: [id number] Arrival date & time: 06/10/24  1313      History   Chief Complaint Chief Complaint  Patient presents with   Sore Throat    lymph node on neck is super swollen and sore - Entered by patient   Otalgia    HPI Melissa Pierce is a 38 y.o. female.   Patient presents for evaluation of left-sided sore throat, left-sided lymph node swelling and left-sided ear pain beginning 1 day ago.  Has attempted use of Tylenol and ibuprofen.  Tolerable to food and liquids.  Denies known sick contact.  Denies fever, cough or congestion.   Past Medical History:  Diagnosis Date   ADHD    Dysmenorrhea    Family history of breast cancer 09/2014   BRCA neg; IBIS=22%; update testing letter sent. Direct discussion on 09/20/2019   Ganglion of foot, left 02/14/2017   Ganglion of foot, right 02/14/2017   Increased risk of breast cancer    Tarsal tunnel syndrome    Determined only ganglian cyst    Patient Active Problem List   Diagnosis Date Noted   Family history of breast cancer 03/19/2018   ADHD    Paresthesia of foot, bilateral 08/09/2016    Past Surgical History:  Procedure Laterality Date   APPENDECTOMY     APPENDECTOMY  02/22/2012   CESAREAN SECTION     CESAREAN SECTION  06/12/2013   WISDOM TOOTH EXTRACTION      OB History     Gravida  4   Para  2   Term  2   Preterm  0   AB  2   Living         SAB  2   IAB  0   Ectopic  0   Multiple      Live Births               Home Medications    Prior to Admission medications   Medication Sig Start Date End Date Taking? Authorizing Provider  amoxicillin  (AMOXIL ) 500 MG capsule Take 1 capsule (500 mg total) by mouth 2 (two) times daily for 7 days. 06/10/24 06/17/24 Yes [place] Dost, Shelba SAUNDERS, NP  albuterol  (VENTOLIN  HFA) 108 (90 Base) MCG/ACT inhaler Inhale 1-2 puffs into the lungs every 6 (six) hours as needed for wheezing or shortness of breath. Patient not taking: Reported on  11/18/2022 04/11/22   Corlis Burnard DEL, NP  amoxicillin -clavulanate (AUGMENTIN ) 875-125 MG tablet Take 1 tablet by mouth every 12 (twelve) hours. 03/11/24   Corlis Burnard DEL, NP  atomoxetine (STRATTERA) 80 MG capsule Take 80 mg by mouth daily.    [provider]  dextroamphetamine (DEXEDRINE SPANSULE) 15 MG 24 hr capsule Take 1 capsule (15 mg total) by mouth daily. 03/05/18   Karamalegos, Marsa PARAS, DO  loratadine (CLARITIN) 10 MG tablet Take 10 mg by mouth daily.    [provider]  sertraline (ZOLOFT) 25 MG tablet Take 25 mg by mouth daily.    [provider]    Family History Family History  Problem Relation Age of Onset   Heart disease Father    Diabetes Father    Melanoma Father 59   Colon polyps Father    Pancreatic cancer Paternal Grandmother 33   Lung cancer Paternal Grandfather 80   Breast cancer Paternal Aunt 35       metastatic   Breast cancer Paternal Aunt  60s   Breast cancer Cousin 31       Paternal (Dgtr of [place] w/Breast) metastatic    Social History Social History   Tobacco Use   Smoking status: Former   Smokeless tobacco: Never  Vaping Use   Vaping status: Never Used  Substance Use Topics   Alcohol use: Yes    Alcohol/week: 1.0 standard drink of alcohol    Types: 1 Glasses of wine per week    Comment: occasionally   Drug use: No     Allergies   Iodides   Review of Systems Review of Systems   Physical Exam Triage Vital Signs ED Triage Vitals [06/10/24 1329]  Encounter Vitals Group     BP      Girls Systolic BP Percentile      Girls Diastolic BP Percentile      Boys Systolic BP Percentile      Boys Diastolic BP Percentile      Pulse      Resp      Temp      Temp src      SpO2      Weight      Height      Head Circumference      Peak Flow      Pain Score 9     Pain Loc      Pain Education      Exclude from Growth Chart    No data found.  Updated Vital Signs LMP 05/31/2024 (Exact Date)   Visual  Acuity Right Eye Distance:   Left Eye Distance:   Bilateral Distance:    Right Eye Near:   Left Eye Near:    Bilateral Near:     Physical Exam Constitutional:      Appearance: She is well-developed.  HENT:     Right Ear: Tympanic membrane and ear canal normal.     Left Ear: Tympanic membrane and ear canal normal.     Nose: No congestion or rhinorrhea.     Mouth/Throat:     Pharynx: Posterior oropharyngeal erythema present.     Tonsils: Tonsillar exudate present. 3+ on the left.  Pulmonary:     Effort: Pulmonary effort is normal.  Lymphadenopathy:     Cervical: Cervical adenopathy present.  Neurological:     Mental Status: She is alert and oriented to person, place, and time.      UC Treatments / Results  Labs (all labs ordered are listed, but only abnormal results are displayed) Labs Reviewed  POCT RAPID STREP A (OFFICE) - Normal  CULTURE, GROUP A STREP [facility])    EKG   Radiology No results found.  Procedures Procedures (including critical care time)  Medications Ordered in UC Medications - No data to display  Initial Impression / Assessment and Plan / UC Course  I have reviewed the triage vital signs and the nursing notes.  Pertinent labs & imaging results that were available during my care of the patient were reviewed by me and considered in my medical decision making (see chart for details).  Acute tonsillitis, sore throat  Erythema, tonsillar adenopathy and exudate present to the left side, exudate noted to the right side, discussed findings with patient, rapid strep negative, sent for culture empirically placed on amoxicillin  based on examination, recommended over-the-counter medications and nonpharmacological supportive care and advised follow-up as needed Final Clinical Impressions(s) / UC Diagnoses   Final diagnoses:  Sore throat  Acute tonsillitis, unspecified etiology  Discharge Instructions      Today you were evaluated for your sore  throat, on exam the left tonsil is red swollen and there is Messiah Rovira patches, there is also Dyane Broberg patches on the right tonsil but it is not red and swollen  Rapid strep test is negative, has been sent to the lab to see if bacteria will grow and you will be notified if this occurs  Based on your examination you are empirically placed on antibiotics for coverage of bacteria, take amoxicillin  twice daily for 7 days  May attempt throat lozenges, soft foods warm liquids and over-the-counter Tylenol and Motrin to help with discomfort  You may massage to help warm compresses to the lymph nodes to help reduce swelling  If symptoms continue to persist or worsen you may follow-up for reevaluation   ED Prescriptions     Medication Sig Dispense Auth. Provider   amoxicillin  (AMOXIL ) 500 MG capsule Take 1 capsule (500 mg total) by mouth 2 (two) times daily for 7 days. 14 capsule Carl Bleecker, Shelba SAUNDERS, NP      PDMP not reviewed this encounter.   Teresa Shelba SAUNDERS, NP 06/10/24 1352

## 2024-06-10 NOTE — ED Triage Notes (Signed)
 Patient complains of sore throat, swollen lymph node left side and left ear pain that started yesterday. Rates ear pain 3/10. Rates sore throat 9/10. Patient has taken Tylenol at 7 am and Ibuprofen at 9 am.

## 2024-06-10 NOTE — Discharge Instructions (Signed)
 Today you were evaluated for your sore throat, on exam the left tonsil is red swollen and there is Destany Severns patches, there is also Gustavo Dispenza patches on the right tonsil but it is not red and swollen  Rapid strep test is negative, has been sent to the lab to see if bacteria will grow and you will be notified if this occurs  Based on your examination you are empirically placed on antibiotics for coverage of bacteria, take amoxicillin  twice daily for 7 days  May attempt throat lozenges, soft foods warm liquids and over-the-counter Tylenol and Motrin to help with discomfort  You may massage to help warm compresses to the lymph nodes to help reduce swelling  If symptoms continue to persist or worsen you may follow-up for reevaluation

## 2024-06-11 LAB — CULTURE, GROUP A STREP (THRC)

## 2024-06-12 ENCOUNTER — Ambulatory Visit (INDEPENDENT_AMBULATORY_CARE_PROVIDER_SITE_OTHER): Admitting: Family Medicine

## 2024-06-12 ENCOUNTER — Encounter: Payer: Self-pay | Admitting: Family Medicine

## 2024-06-12 VITALS — BP 110/70 | HR 78 | Ht 64.0 in | Wt 154.4 lb

## 2024-06-12 DIAGNOSIS — J020 Streptococcal pharyngitis: Secondary | ICD-10-CM

## 2024-06-12 DIAGNOSIS — J029 Acute pharyngitis, unspecified: Secondary | ICD-10-CM

## 2024-06-12 LAB — POC COVID19 BINAXNOW: SARS Coronavirus 2 Ag: NEGATIVE

## 2024-06-12 MED ORDER — AZITHROMYCIN 250 MG PO TABS: ORAL_TABLET | ORAL | 0 refills | Status: AC

## 2024-06-12 NOTE — Patient Instructions (Addendum)
 Thank you for coming to the office today.  Likely still strep throat. COVID was negative  Start Azithromycin  Z pak (antibiotic) 2 tabs day 1, then 1 tab x 4 days, complete entire course even if improved  Okay to dose increase Ibuprofen to 600mg  if need with your dosing.  Okay with Tylenol  Please schedule a Follow-up Appointment to: Return if symptoms worsen or fail to improve.  If you have any other questions or concerns, please feel free to call the office or send a message through MyChart. You may also schedule an earlier appointment if necessary.  Additionally, you may be receiving a survey about your experience at our office within a few days to 1 week by e-mail or mail. We value your feedback.  Marsa Officer, DO [facility], [place]

## 2024-06-12 NOTE — Progress Notes (Signed)
 Subjective:    Patient ID: Melissa Pierce, female    DOB: [birth year], 38 y.o.   MRN: [id number]  Melissa Pierce is a 38 y.o. female presenting on 06/12/2024 for Sore Throat   HPI  Discussed the use of AI scribe software for clinical note transcription with the patient, who gave verbal consent to proceed.  History of Present Illness   Melissa Pierce is a 38 year old female who presents with persistent sore throat and tonsillitis.  Acute Pharyngitis and tonsillar symptoms - Persistent sore throat and tonsillitis since Sunday night - Initial symptom was a sore left cervical gland, described as feeling like a 'golf ball' by Monday morning - White spots initially present on the left side of the throat, now spread to the right side - Throat remains sore and tender to palpation - No improvement after four doses of amoxicillin  - No cough or nasal congestion - No known sick contacts - Uncertain source of infection  Systemic symptoms - Persistent headache - Exhaustion and profound fatigue, with inability to engage in usual running activities - Often in bed by 3 PM due to low energy  Response to treatment - Diagnosed with tonsillitis at urgent care on Monday after positive lab strep test culture (rapid strep negative) - Prescribed amoxicillin  500mg  x 7 days, with no clinical improvement after four doses - Pain managed with alternating doses of Tylenol and ibuprofen every few hours - History of strep throat with typically rapid response to treatment, but not responding as expected in this episode         06/12/2024    8:49 AM 11/18/2022   10:08 AM 05/20/2021    8:44 AM  Depression screen PHQ 2/9  Decreased Interest 0 0 0  Down, Depressed, Hopeless 0 0 0  PHQ - 2 Score 0 0 0  Altered sleeping 1  1  Tired, decreased energy 0  1  Change in appetite 0  0  Feeling bad or failure about yourself  0  0  Trouble concentrating 1  0  Moving slowly or fidgety/restless 0  0   Suicidal thoughts 0  0  PHQ-9 Score 2  2  Difficult doing work/chores Not difficult at all  Not difficult at all       06/12/2024    8:50 AM  GAD 7 : Generalized Anxiety Score  Nervous, Anxious, on Edge 0  Control/stop worrying 0  Worry too much - different things 0  Trouble relaxing 0  Restless 0  Easily annoyed or irritable 0  Afraid - awful might happen 0  Total GAD 7 Score 0  Anxiety Difficulty Not difficult at all    Social History   Tobacco Use   Smoking status: Former   Smokeless tobacco: Never  Vaping Use   Vaping status: Never Used  Substance Use Topics   Alcohol use: Yes    Alcohol/week: 1.0 standard drink of alcohol    Types: 1 Glasses of wine per week    Comment: occasionally   Drug use: No    Review of Systems Per HPI unless specifically indicated above     Objective:    BP 110/70 (BP Location: Right Arm, Patient Position: Sitting, Cuff Size: Normal)   Pulse 78   Ht 5' 4 (1.626 m)   Wt 154 lb 6 oz (70 kg)   LMP 05/31/2024 (Exact Date)   SpO2 96%   BMI 26.50 kg/m   Wt Readings from Last 3 Encounters:  06/12/24 154 lb 6 oz (70 kg)  05/14/24 156 lb (70.8 kg)  11/18/22 156 lb 6.4 oz (70.9 kg)    Physical Exam Vitals and nursing note reviewed.  Constitutional:      General: She is not in acute distress.    Appearance: Normal appearance. She is well-developed. She is not diaphoretic.     Comments: Well-appearing, comfortable, cooperative  HENT:     Head: Normocephalic and atraumatic.     Right Ear: Tympanic membrane, ear canal and external ear normal. There is no impacted cerumen.     Left Ear: Tympanic membrane, ear canal and external ear normal. There is no impacted cerumen.     Nose: No congestion or rhinorrhea.     Mouth/Throat:     Mouth: Mucous membranes are moist.     Pharynx: Oropharyngeal exudate and posterior oropharyngeal erythema present.     Comments: Bilateral tonsils with localized edema, exudates white patches and  erythema. Eyes:     General:        Right eye: No discharge.        Left eye: No discharge.     Conjunctiva/sclera: Conjunctivae normal.  Cardiovascular:     Rate and Rhythm: Normal rate.  Pulmonary:     Effort: Pulmonary effort is normal. No respiratory distress.     Breath sounds: Normal breath sounds. No wheezing, rhonchi or rales.  Lymphadenopathy:     Cervical: Cervical adenopathy (bilateral anterior localized fullness adenopathy some tenderness) present.  Skin:    General: Skin is warm and dry.     Findings: No erythema or rash.  Neurological:     Mental Status: She is alert and oriented to person, place, and time.  Psychiatric:        Mood and Affect: Mood normal.        Behavior: Behavior normal.        Thought Content: Thought content normal.     Comments: Well groomed, good eye contact, normal speech and thoughts     Results for orders placed or performed in visit on 06/12/24  POC COVID-19 BinaxNow   Collection Time: 06/12/24  9:15 AM  Result Value Ref Range   SARS Coronavirus 2 Ag Negative Negative      Assessment & Plan:   Problem List Items Addressed This Visit   None Visit Diagnoses       Acute streptococcal pharyngitis    -  Primary   Relevant Medications   azithromycin  (ZITHROMAX  Z-PAK) 250 MG tablet     Sore throat       Relevant Orders   POC COVID-19 BinaxNow (Completed)       Acute streptococcal tonsillitis Exam with acute tonsillitis, no shift or swelling of tonsils or uvula. She has white patches ulcerated on both tonsils with exudates. Questioned some tonsillith but not consistent on exam. Recent visit at Urgent Care on 06/10/24 with negative Rapid strep but Confirmed by positive strep culture.  Symptoms persist despite 5 doses (2.5 days) amoxicillin , suggesting ineffective. She says that her daughter has history of PCN being ineffective.  - Will agree to switch to alternative treatment with Azithromycin  Zpak course now, pause amoxicillin , may  restart Amoxicillin  if still needed, and we can add the remaining 3 days for total 10 day course if interested - Advise alternating acetaminophen and ibuprofen for pain. - May dose ibuprofen up to 600mg  if need  Possible / Suspected COVID-19 infection Sore throat and lack of antibiotic response suggest COVID-19 as  differential. Fatigue and headache consistent with COVID-19. - Perform COVID-19 test today, result was NEGATIVE, this is reassuring overall.     Orders Placed This Encounter  Procedures   POC COVID-19 BinaxNow    Meds ordered this encounter  Medications   azithromycin  (ZITHROMAX  Z-PAK) 250 MG tablet    Sig: Take 2 tabs (500mg  total) on Day 1. Take 1 tab (250mg ) daily for next 4 days.    Dispense:  6 tablet    Refill:  0    Follow up plan: Return if symptoms worsen or fail to improve.   Marsa Officer, DO [facility] 06/12/2024, 8:56 AM

## 2024-06-26 ENCOUNTER — Ambulatory Visit: Admitting: Internal Medicine
